# Patient Record
Sex: Male | Born: 1945
Health system: Southern US, Community
[De-identification: ages and names within clinical notes are randomized; demographics above are authoritative.]

## PROBLEM LIST (undated history)

## (undated) DIAGNOSIS — C4491 Basal cell carcinoma of skin, unspecified: Secondary | ICD-10-CM

## (undated) DIAGNOSIS — I5042 Chronic combined systolic (congestive) and diastolic (congestive) heart failure: Secondary | ICD-10-CM

## (undated) DIAGNOSIS — Q231 Congenital insufficiency of aortic valve: Secondary | ICD-10-CM

## (undated) DIAGNOSIS — E669 Obesity, unspecified: Secondary | ICD-10-CM

## (undated) DIAGNOSIS — Q2381 Bicuspid aortic valve: Secondary | ICD-10-CM

## (undated) DIAGNOSIS — I480 Paroxysmal atrial fibrillation: Secondary | ICD-10-CM

## (undated) DIAGNOSIS — G629 Polyneuropathy, unspecified: Secondary | ICD-10-CM

## (undated) DIAGNOSIS — M1990 Unspecified osteoarthritis, unspecified site: Secondary | ICD-10-CM

## (undated) HISTORY — DX: Chronic combined systolic (congestive) and diastolic (congestive) heart failure: I50.42

## (undated) HISTORY — PX: TONSILLECTOMY: SUR1361

## (undated) HISTORY — PX: KNEE SURGERY: SHX244

## (undated) HISTORY — DX: Bicuspid aortic valve: Q23.81

## (undated) HISTORY — DX: Congenital insufficiency of aortic valve: Q23.1

## (undated) HISTORY — DX: Polyneuropathy, unspecified: G62.9

## (undated) HISTORY — DX: Paroxysmal atrial fibrillation: I48.0

## (undated) HISTORY — PX: REPLACEMENT TOTAL KNEE: SUR1224

---

## 2008-09-27 ENCOUNTER — Ambulatory Visit: Payer: Self-pay | Admitting: Gastroenterology

## 2009-04-12 ENCOUNTER — Ambulatory Visit: Payer: Self-pay | Admitting: Unknown Physician Specialty

## 2009-04-24 ENCOUNTER — Ambulatory Visit: Payer: Self-pay | Admitting: Unknown Physician Specialty

## 2009-04-30 ENCOUNTER — Ambulatory Visit: Payer: Self-pay | Admitting: Unknown Physician Specialty

## 2011-05-26 ENCOUNTER — Ambulatory Visit: Payer: Self-pay

## 2011-06-14 ENCOUNTER — Ambulatory Visit: Payer: Self-pay | Admitting: Family Medicine

## 2012-10-20 ENCOUNTER — Ambulatory Visit: Payer: Self-pay | Admitting: Family Medicine

## 2015-05-22 ENCOUNTER — Ambulatory Visit (INDEPENDENT_AMBULATORY_CARE_PROVIDER_SITE_OTHER): Payer: Commercial Managed Care - HMO | Admitting: Family Medicine

## 2015-05-22 ENCOUNTER — Encounter: Payer: Self-pay | Admitting: Family Medicine

## 2015-05-22 VITALS — BP 102/65 | HR 65 | Temp 97.8°F | Ht 72.0 in | Wt 233.0 lb

## 2015-05-22 DIAGNOSIS — S86911A Strain of unspecified muscle(s) and tendon(s) at lower leg level, right leg, initial encounter: Secondary | ICD-10-CM

## 2015-05-22 DIAGNOSIS — S86919A Strain of unspecified muscle(s) and tendon(s) at lower leg level, unspecified leg, initial encounter: Secondary | ICD-10-CM | POA: Insufficient documentation

## 2015-05-22 DIAGNOSIS — S86811A Strain of other muscle(s) and tendon(s) at lower leg level, right leg, initial encounter: Secondary | ICD-10-CM | POA: Diagnosis not present

## 2015-05-22 MED ORDER — MELOXICAM 15 MG PO TABS: 15.0000 mg | ORAL_TABLET | Freq: Every day | ORAL | Status: DC

## 2015-05-22 NOTE — Assessment & Plan Note (Signed)
Discuss knee care use of meloxicam Using neoprene sleeve Knee exercises May use Tylenol also If problems persist may need further evaluation.

## 2015-05-22 NOTE — Progress Notes (Signed)
   BP 102/65 mmHg  Pulse 65  Temp(Src) 97.8 F (36.6 C)  Ht 6' (1.829 m)  Wt 233 lb (105.688 kg)  BMI 31.59 kg/m2  SpO2 96%   Subjective:    Patient ID: Christopher Vargas, male    DOB: [birth year], 69 y.o.   MRN: [id number]  HPI: TIMITHY Vargas is a 69 y.o. male  Chief Complaint  Patient presents with  . Knee Pain    interior right knee x 1 week   Patient with right medial knee pain over the last week. But is also had some intermittent chronic knee pain of arthritis off and on over the over the years. Both knees of been scoped for arthritis Patient a week ago just slightly twisted in his knee got hurt. Has been more than source since has taken some Aleve with modest help Has used Ace wrap and a neoprene sleeve. There's been no redness swelling joint locking clicking.   Relevant past medical, surgical, family and social history reviewed and updated as indicated. Interim medical history since our last visit reviewed. Allergies and medications reviewed and updated.  Review of Systems  Per HPI unless specifically indicated above     Objective:    BP 102/65 mmHg  Pulse 65  Temp(Src) 97.8 F (36.6 C)  Ht 6' (1.829 m)  Wt 233 lb (105.688 kg)  BMI 31.59 kg/m2  SpO2 96%  Wt Readings from Last 3 Encounters:  05/22/15 233 lb (105.688 kg)  12/31/14 239 lb (108.41 kg)  12/31/14 239 lb (108.41 kg)    Physical Exam  Musculoskeletal:  Knees with no evidence of redness or swelling Right knee some tenderness along the medial collateral ligament no clicking locking no joint laxity    No results found for this or any previous visit.    Assessment & Plan:   Problem List Items Addressed This Visit      Other   Strain of knee - Primary    Discuss knee care use of meloxicam Using neoprene sleeve Knee exercises May use Tylenol also If problems persist may need further evaluation.      Relevant Medications   meloxicam (MOBIC) 15 MG tablet       Follow up  plan: Return if symptoms worsen or fail to improve.

## 2015-07-22 ENCOUNTER — Telehealth: Payer: Self-pay | Admitting: Family Medicine

## 2015-07-22 DIAGNOSIS — M25561 Pain in right knee: Principal | ICD-10-CM

## 2015-07-22 DIAGNOSIS — G8929 Other chronic pain: Secondary | ICD-10-CM

## 2015-07-22 NOTE — Telephone Encounter (Signed)
Phone call Discussed with patient right knee just not getting better in fact getting worse We have tried care unsuccessfully here will refer to orthopedics patient prefers Dr. Jefm Bryant at [facility]

## 2015-07-22 NOTE — Telephone Encounter (Signed)
Pt called stated he is still having issues out of his knee, wants to know if he should come back in to see [facility] or request a referral for Ortho. Please call pt to discuss options. Thanks.

## 2015-09-05 ENCOUNTER — Other Ambulatory Visit: Payer: Self-pay | Admitting: Orthopedic Surgery

## 2015-09-05 DIAGNOSIS — M25561 Pain in right knee: Secondary | ICD-10-CM

## 2015-09-05 DIAGNOSIS — M1711 Unilateral primary osteoarthritis, right knee: Secondary | ICD-10-CM

## 2015-09-25 ENCOUNTER — Ambulatory Visit
Admission: RE | Admit: 2015-09-25 | Discharge: 2015-09-25 | Disposition: A | Discharge status: Home / Self Care | Payer: Commercial Managed Care - HMO | Source: Ambulatory Visit | Attending: Orthopedic Surgery | Admitting: Orthopedic Surgery

## 2015-09-25 DIAGNOSIS — M1711 Unilateral primary osteoarthritis, right knee: Secondary | ICD-10-CM | POA: Insufficient documentation

## 2015-09-25 DIAGNOSIS — M7121 Synovial cyst of popliteal space [Baker], right knee: Secondary | ICD-10-CM | POA: Insufficient documentation

## 2015-09-25 DIAGNOSIS — X501XXA Overexertion from prolonged static or awkward postures, initial encounter: Secondary | ICD-10-CM | POA: Insufficient documentation

## 2015-09-25 DIAGNOSIS — S83203A Other tear of unspecified meniscus, current injury, right knee, initial encounter: Secondary | ICD-10-CM | POA: Diagnosis not present

## 2015-09-25 DIAGNOSIS — M25561 Pain in right knee: Secondary | ICD-10-CM | POA: Insufficient documentation

## 2015-09-25 DIAGNOSIS — M25461 Effusion, right knee: Secondary | ICD-10-CM | POA: Insufficient documentation

## 2016-07-21 ENCOUNTER — Telehealth: Payer: Self-pay

## 2016-07-21 NOTE — Telephone Encounter (Signed)
Left message to call to find out more about his rash.

## 2016-07-21 NOTE — Telephone Encounter (Signed)
Christopher Vargas with [facility] left message that they are seeing patient for s/p knee replacement and that he has a rash on his back.

## 2016-07-21 NOTE — Telephone Encounter (Signed)
Larene Beach returned my call and stated that he just has a rash on his back. It is improving and healing, no open areas. They will monitor it for any changes.

## 2016-08-25 ENCOUNTER — Ambulatory Visit (INDEPENDENT_AMBULATORY_CARE_PROVIDER_SITE_OTHER): Payer: Commercial Managed Care - HMO | Admitting: Family Medicine

## 2016-08-25 ENCOUNTER — Encounter: Payer: Self-pay | Admitting: Family Medicine

## 2016-08-25 VITALS — BP 128/83 | HR 76 | Temp 97.6°F | Wt 242.0 lb

## 2016-08-25 DIAGNOSIS — J069 Acute upper respiratory infection, unspecified: Secondary | ICD-10-CM

## 2016-08-25 MED ORDER — AZITHROMYCIN 250 MG PO TABS: ORAL_TABLET | ORAL | 0 refills | Status: DC

## 2016-08-25 MED ORDER — HYDROCOD POLST-CPM POLST ER 10-8 MG/5ML PO SUER: 5.0000 mL | Freq: Two times a day (BID) | ORAL | 0 refills | Status: DC | PRN

## 2016-08-25 MED ORDER — BENZONATATE 100 MG PO CAPS: 200.0000 mg | ORAL_CAPSULE | Freq: Three times a day (TID) | ORAL | 0 refills | Status: DC | PRN

## 2016-08-25 NOTE — Progress Notes (Signed)
   BP 128/83   Pulse 76   Temp 97.6 F (36.4 C)   Wt 242 lb (109.8 kg)   SpO2 93%   BMI 32.82 kg/m    Subjective:    Patient ID: Christopher Vargas, male    DOB: 1946/08/07, 70 y.o.   MRN: [id number]  HPI: Christopher Vargas is a 70 y.o. male  Chief Complaint  Patient presents with  . URI    x 5 days, Head and chest congestion, sneezing, coughing.   Patient presents with almost a week of nasal congestion, sneezing, sore throat, and productive cough. Denies fever, chills, sweats, SOB, sinus pain. Taking sudafed and mucinex with minimal relief. Wife sick with same sxs.   Relevant past medical, surgical, family and social history reviewed and updated as indicated. Interim medical history since our last visit reviewed. Allergies and medications reviewed and updated.  Review of Systems  Constitutional: Negative.   HENT: Positive for congestion, rhinorrhea, sneezing and sore throat.   Eyes: Negative.   Respiratory: Positive for cough.   Cardiovascular: Negative.   Gastrointestinal: Negative.   Genitourinary: Negative.   Musculoskeletal: Negative.   Neurological: Negative.   Psychiatric/Behavioral: Negative.     Per HPI unless specifically indicated above     Objective:    BP 128/83   Pulse 76   Temp 97.6 F (36.4 C)   Wt 242 lb (109.8 kg)   SpO2 93%   BMI 32.82 kg/m   Wt Readings from Last 3 Encounters:  08/25/16 242 lb (109.8 kg)  05/22/15 233 lb (105.7 kg)  12/31/14 239 lb (108.4 kg)    Physical Exam  Constitutional: He is oriented to person, place, and time. He appears well-developed and well-nourished.  HENT:  Head: Atraumatic.  Right Ear: External ear normal.  Left Ear: External ear normal.  Nose: Nose normal.  Mouth/Throat: Oropharynx is clear and moist. No oropharyngeal exudate.  Eyes: Conjunctivae are normal. Pupils are equal, round, and reactive to light.  Neck: Normal range of motion. Neck supple.  Cardiovascular: Normal rate and normal heart  sounds.   Pulmonary/Chest: Effort normal and breath sounds normal. No respiratory distress.  Musculoskeletal: Normal range of motion.  Neurological: He is alert and oriented to person, place, and time.  Skin: Skin is warm and dry.  Psychiatric: He has a normal mood and affect. His behavior is normal.  Nursing note and vitals reviewed.   No results found for this or any previous visit.    Assessment & Plan:   Problem List Items Addressed This Visit    None    Visit Diagnoses    Upper respiratory tract infection, unspecified type    -  Primary   Will treat with z-pak, albuterol inhaler prn, tessalon perles and tussionex. Precautions given with tussionex. Cont OTC meds. Follow up if no improvement   Relevant Medications   azithromycin (ZITHROMAX) 250 MG tablet       Follow up plan: Return if symptoms worsen or fail to improve.

## 2016-08-25 NOTE — Patient Instructions (Signed)
Follow up as needed

## 2016-10-01 ENCOUNTER — Ambulatory Visit (INDEPENDENT_AMBULATORY_CARE_PROVIDER_SITE_OTHER): Payer: Medicare HMO

## 2016-10-01 DIAGNOSIS — Z23 Encounter for immunization: Secondary | ICD-10-CM | POA: Diagnosis not present

## 2017-05-24 DIAGNOSIS — M170 Bilateral primary osteoarthritis of knee: Secondary | ICD-10-CM | POA: Diagnosis not present

## 2017-05-24 DIAGNOSIS — Z791 Long term (current) use of non-steroidal anti-inflammatories (NSAID): Secondary | ICD-10-CM | POA: Diagnosis not present

## 2017-05-24 DIAGNOSIS — Z85828 Personal history of other malignant neoplasm of skin: Secondary | ICD-10-CM | POA: Diagnosis not present

## 2017-05-24 DIAGNOSIS — Z6829 Body mass index (BMI) 29.0-29.9, adult: Secondary | ICD-10-CM | POA: Diagnosis not present

## 2017-05-24 DIAGNOSIS — Z0000 Encounter for general adult medical examination without abnormal findings: Secondary | ICD-10-CM | POA: Diagnosis not present

## 2017-07-14 ENCOUNTER — Encounter: Payer: Self-pay | Admitting: Family Medicine

## 2017-07-14 DIAGNOSIS — R69 Illness, unspecified: Secondary | ICD-10-CM | POA: Diagnosis not present

## 2017-09-17 IMAGING — MR MR KNEE*R* W/O CM
4 of 5 series · 31 of 40 positions shown · non-contrast
Comparison: Report from 04/12/2009

CLINICAL DATA: Twisting injury of the right knee 3 months ago.
Prior knee surgery. Diffuse knee pain with cracking and popping
sensation.

EXAM:
MRI OF THE RIGHT KNEE WITHOUT CONTRAST
TECHNIQUE: Multiplanar, multisequence MR imaging of the knee was performed. No
intravenous contrast was administered.

[Series 3: PD fat-sat · axial · 3.0mm · 0.50mm/px · z∈[-80,+43]mm · 8 of 38 slices shown (1 of 3)]
[im 1/38]
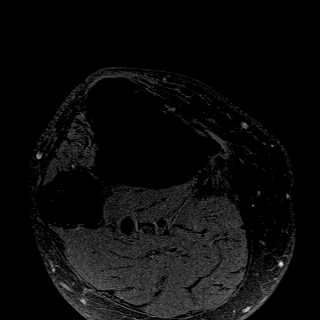
[im 6/38]
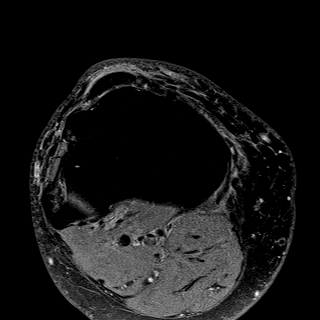
[im 11/38]
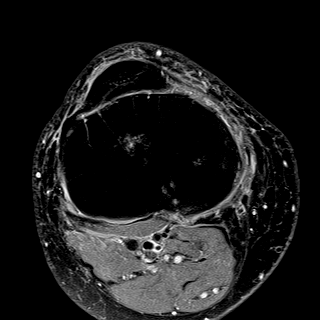
[im 16/38]
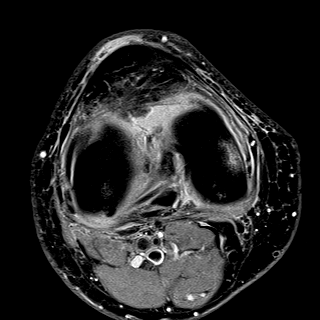
[im 22/38]
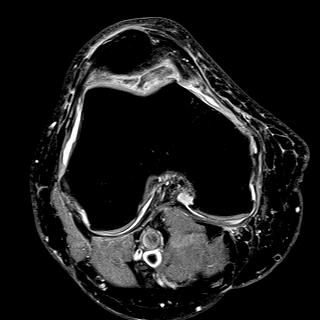
[im 27/38]
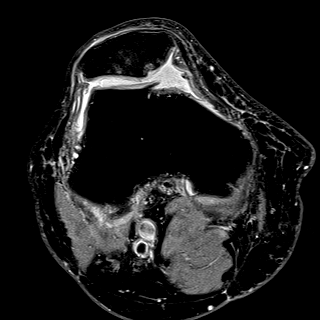
[im 32/38]
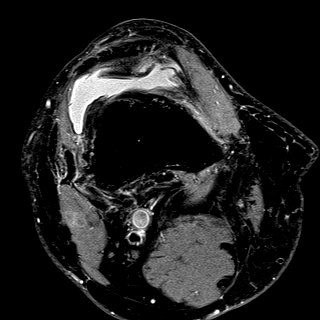
[im 38/38]
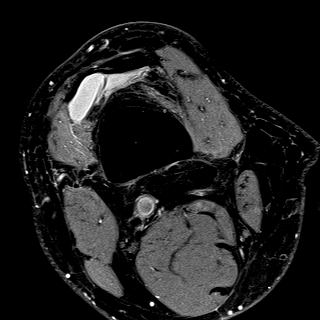

[Series 5: T2 fat-sat · coronal · 3.0mm · 0.31mm/px · 7 of 36 slices shown]
[im 1/36]
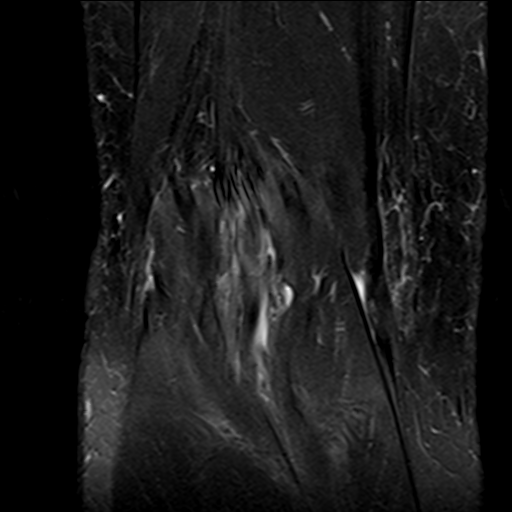
[im 6/36]
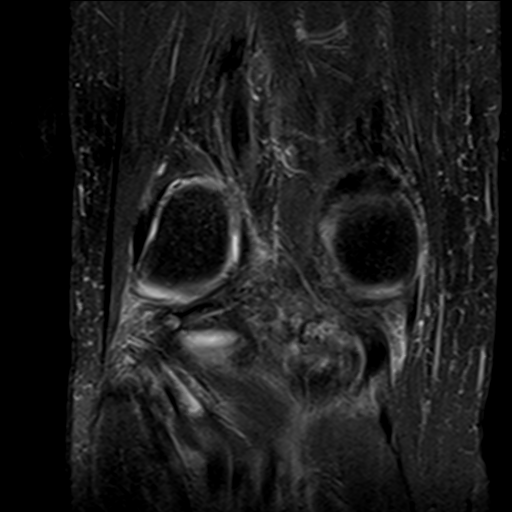
[im 11/36]
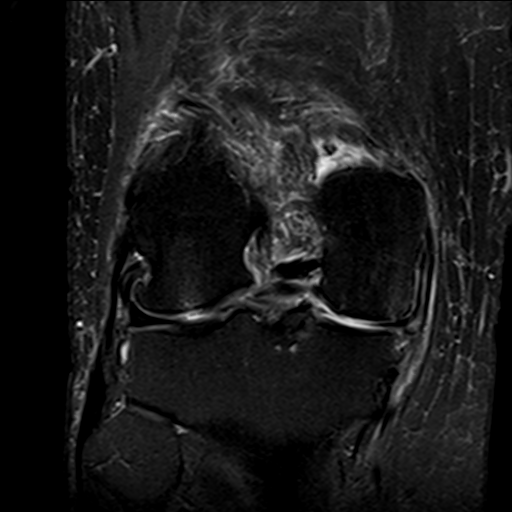
[im 16/36]
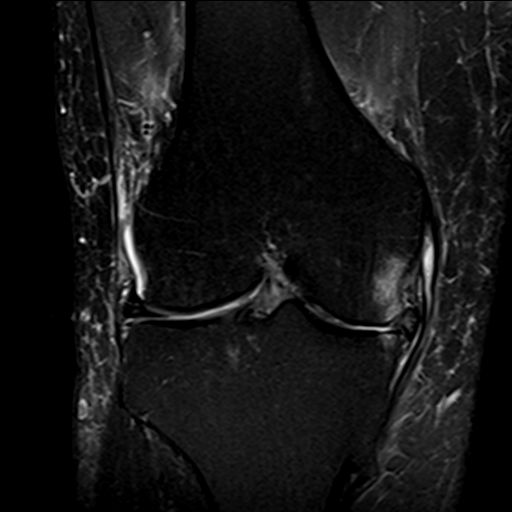
[im 21/36]
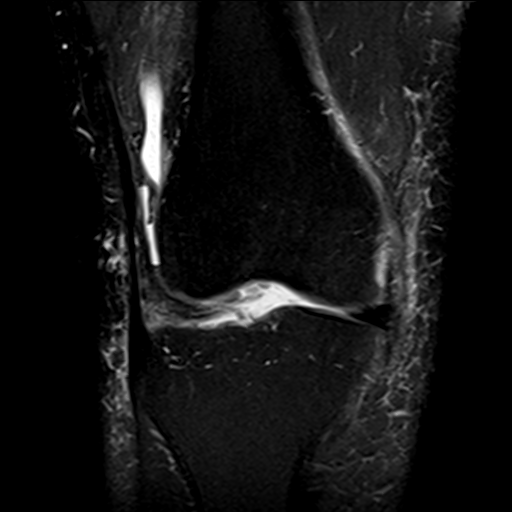
[im 26/36]
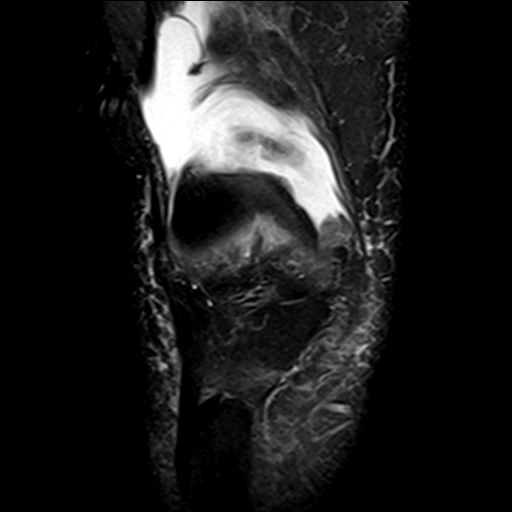
[im 31/36]
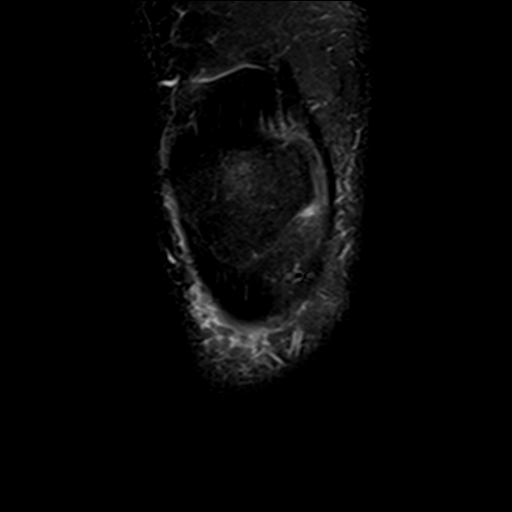

[Series 6: PD fat-sat · coronal · 3.0mm · 0.50mm/px · 8 of 36 slices shown (2 of 3)]
[im 1/36]
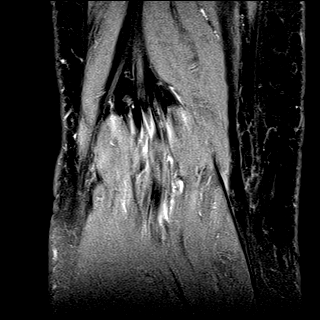
[im 6/36]
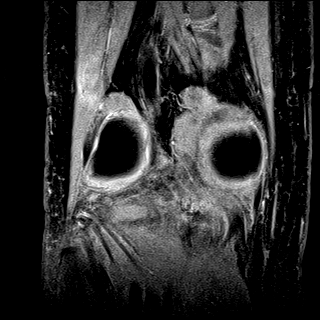
[im 11/36]
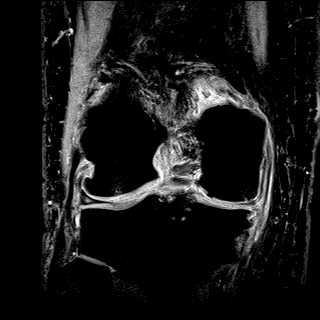
[im 16/36]
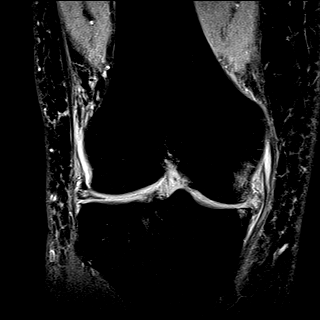
[im 21/36]
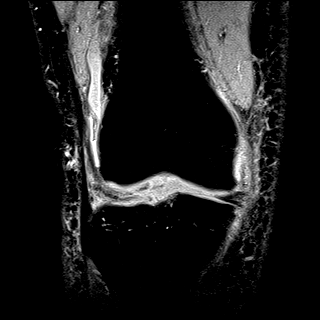
[im 26/36]
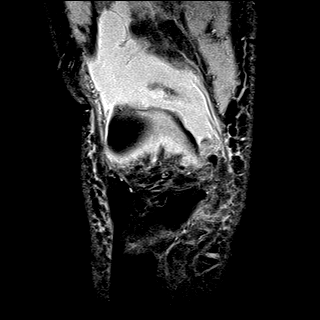
[im 31/36]
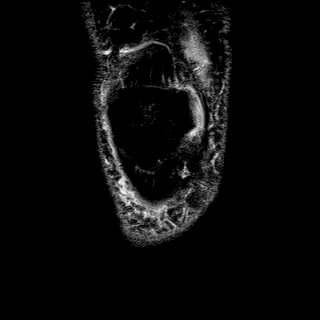
[im 36/36]
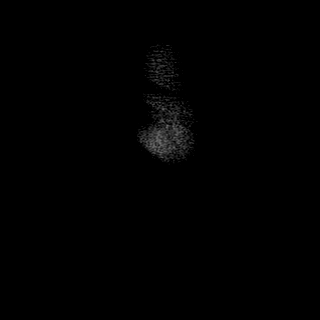

[Series 7: PD fat-sat · sagittal · 3.0mm · 0.50mm/px · 8 of 38 slices shown (3 of 3)]
[im 1/38]
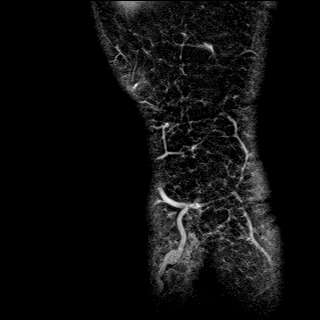
[im 6/38]
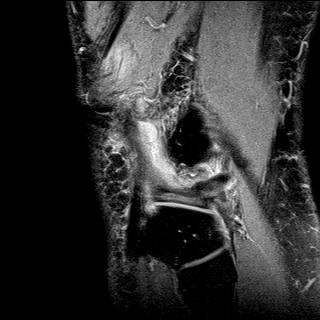
[im 11/38]
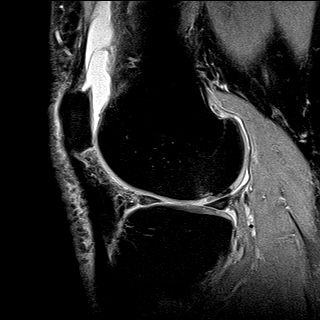
[im 16/38]
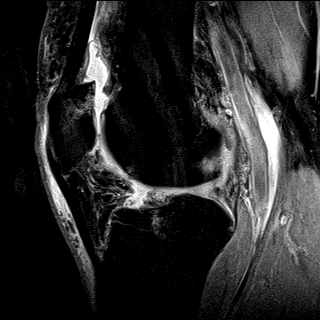
[im 22/38]
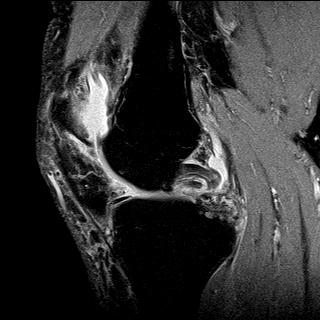
[im 27/38]
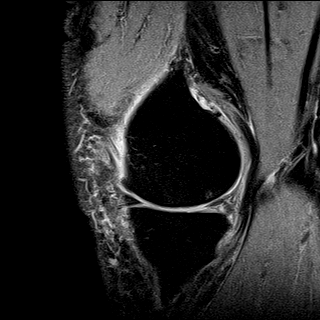
[im 32/38]
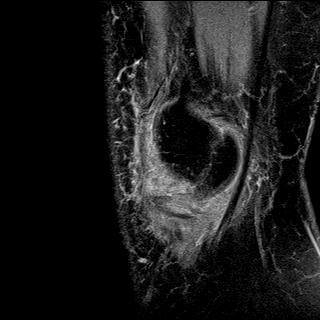
[im 38/38]
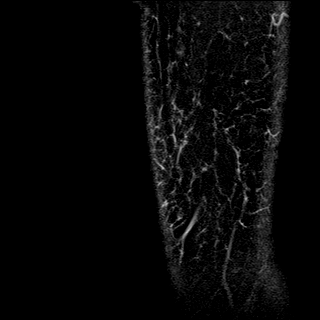

[31 of 40 positions shown; findings below may reference images not displayed]

FINDINGS: MENISCI

Medial meniscus: Vertical grade 3 signal in the posterior horn
medial meniscus extends longitudinally and there is also extensive
increased signal along the inferior surface extending longitudinally
in the posterior horn. In the midbody there is poor definition of
medial meniscal tissue due to severe degeneration, with abnormal
high signal along the entire superior and inferior surface. A grade
3 horizontal signal is present in the adjacent anterior horn medial
meniscus.

Lateral meniscus: Grade 1 signal in the posterior horn and anterior
horn of the lateral meniscus with equivocal horizontal extension to
the free edge in the midbody on images 67 series 7 and image 14
series 6.

LIGAMENTS

Cruciates: Slightly indistinct but otherwise intact ACL. PCL intact.

Collaterals: MCL bursitis superiorly, image 16 series 5. Proximal
popliteus tendinopathy potentially with partial intrasubstance
tearing in the origin of the popliteus tendon, image 12 series 5.

CARTILAGE

Patellofemoral: Severe and irregular chondral thinning,
full-thickness in a variety of locations, along the patellar facets
and posterior patellar ridge with scattered degenerative subcortical
foci of edema. Moderate to severe degenerative chondral thinning in
the femoral trochlear groove which appears somewhat shallow.
Marginal spurring.

Medial: Severe full-thickness loss of articular cartilage with
degenerative subcortical foci of edema most notably medially in the
medial femoral condyle. Marginal spurring.

Lateral: Generally moderate chondral thinning. Focal full-thickness
0.7 cm chondral defect centrally along the lateral femoral condyle,
image 10 series 5. Marginal spurring.

Joint: Moderate knee effusion. Thin medial plica. Low-level edema
infiltrating Hoffa's fat pad in a generalized fashion.

Popliteal Fossa:  Very small Baker's cyst.

Extensor Mechanism:  Unremarkable

Bones: No significant extra-articular osseous abnormalities
identified.
IMPRESSION: 1. Considerable complex signal in the medial meniscus suspicious for
degenerative read tearing.
2. Equivocal degenerative horizontal tearing in the midbody lateral
meniscus, extending to the free edge.
3. MCL bursitis.
4. Proximal popliteus tendinopathy potentially with some
intrasubstance partial tearing.
5. Severe osteoarthritis especially in the medial compartment and
patellofemoral joint. Large areas of full-thickness chondral loss in
the patellofemoral joint and medial compartment. Small focal
full-thickness chondral defect centrally along the lateral femoral
condyle.
6. Moderate knee effusion with very small Baker's cyst.

## 2017-10-27 DIAGNOSIS — R69 Illness, unspecified: Secondary | ICD-10-CM | POA: Diagnosis not present

## 2018-02-25 ENCOUNTER — Other Ambulatory Visit: Payer: Self-pay

## 2018-02-25 ENCOUNTER — Inpatient Hospital Stay
Admission: EM | Admit: 2018-02-25 | Discharge: 2018-03-01 | DRG: 308 | Disposition: A | Discharge status: Home / Self Care | Payer: Medicare HMO | Attending: Internal Medicine | Admitting: Internal Medicine

## 2018-02-25 ENCOUNTER — Inpatient Hospital Stay (HOSPITAL_COMMUNITY)
Admit: 2018-02-25 | Discharge: 2018-02-25 | Disposition: A | Discharge status: Home / Self Care | Payer: Medicare HMO | Attending: Internal Medicine | Admitting: Internal Medicine

## 2018-02-25 ENCOUNTER — Emergency Department: Payer: Medicare HMO

## 2018-02-25 DIAGNOSIS — I4891 Unspecified atrial fibrillation: Secondary | ICD-10-CM

## 2018-02-25 DIAGNOSIS — I509 Heart failure, unspecified: Secondary | ICD-10-CM | POA: Diagnosis not present

## 2018-02-25 DIAGNOSIS — R001 Bradycardia, unspecified: Secondary | ICD-10-CM | POA: Diagnosis not present

## 2018-02-25 DIAGNOSIS — I340 Nonrheumatic mitral (valve) insufficiency: Secondary | ICD-10-CM | POA: Diagnosis not present

## 2018-02-25 DIAGNOSIS — Z8249 Family history of ischemic heart disease and other diseases of the circulatory system: Secondary | ICD-10-CM

## 2018-02-25 DIAGNOSIS — R918 Other nonspecific abnormal finding of lung field: Secondary | ICD-10-CM

## 2018-02-25 DIAGNOSIS — Z96651 Presence of right artificial knee joint: Secondary | ICD-10-CM | POA: Diagnosis present

## 2018-02-25 DIAGNOSIS — I2510 Atherosclerotic heart disease of native coronary artery without angina pectoris: Secondary | ICD-10-CM | POA: Diagnosis not present

## 2018-02-25 DIAGNOSIS — Z791 Long term (current) use of non-steroidal anti-inflammatories (NSAID): Secondary | ICD-10-CM

## 2018-02-25 DIAGNOSIS — G629 Polyneuropathy, unspecified: Secondary | ICD-10-CM | POA: Diagnosis present

## 2018-02-25 DIAGNOSIS — I5031 Acute diastolic (congestive) heart failure: Secondary | ICD-10-CM | POA: Diagnosis not present

## 2018-02-25 DIAGNOSIS — I429 Cardiomyopathy, unspecified: Secondary | ICD-10-CM | POA: Diagnosis present

## 2018-02-25 DIAGNOSIS — I350 Nonrheumatic aortic (valve) stenosis: Secondary | ICD-10-CM

## 2018-02-25 DIAGNOSIS — E669 Obesity, unspecified: Secondary | ICD-10-CM | POA: Diagnosis not present

## 2018-02-25 DIAGNOSIS — Z882 Allergy status to sulfonamides status: Secondary | ICD-10-CM | POA: Diagnosis not present

## 2018-02-25 DIAGNOSIS — I481 Persistent atrial fibrillation: Secondary | ICD-10-CM | POA: Diagnosis not present

## 2018-02-25 DIAGNOSIS — Z6829 Body mass index (BMI) 29.0-29.9, adult: Secondary | ICD-10-CM | POA: Diagnosis not present

## 2018-02-25 DIAGNOSIS — I5021 Acute systolic (congestive) heart failure: Secondary | ICD-10-CM | POA: Diagnosis present

## 2018-02-25 DIAGNOSIS — J810 Acute pulmonary edema: Secondary | ICD-10-CM | POA: Diagnosis not present

## 2018-02-25 DIAGNOSIS — R0602 Shortness of breath: Secondary | ICD-10-CM | POA: Diagnosis not present

## 2018-02-25 DIAGNOSIS — R911 Solitary pulmonary nodule: Secondary | ICD-10-CM | POA: Diagnosis not present

## 2018-02-25 DIAGNOSIS — Z85828 Personal history of other malignant neoplasm of skin: Secondary | ICD-10-CM

## 2018-02-25 DIAGNOSIS — J90 Pleural effusion, not elsewhere classified: Secondary | ICD-10-CM | POA: Diagnosis not present

## 2018-02-25 DIAGNOSIS — Q231 Congenital insufficiency of aortic valve: Secondary | ICD-10-CM | POA: Diagnosis not present

## 2018-02-25 DIAGNOSIS — M1990 Unspecified osteoarthritis, unspecified site: Secondary | ICD-10-CM | POA: Diagnosis not present

## 2018-02-25 DIAGNOSIS — I480 Paroxysmal atrial fibrillation: Secondary | ICD-10-CM | POA: Diagnosis not present

## 2018-02-25 HISTORY — DX: Basal cell carcinoma of skin, unspecified: C44.91

## 2018-02-25 HISTORY — DX: Obesity, unspecified: E66.9

## 2018-02-25 HISTORY — DX: Unspecified osteoarthritis, unspecified site: M19.90

## 2018-02-25 LAB — CBC
HEMATOCRIT: 41.6 % (ref 40.0–52.0)
Hemoglobin: 13.9 g/dL (ref 13.0–18.0)
MCH: 30.2 pg (ref 26.0–34.0)
MCHC: 33.4 g/dL (ref 32.0–36.0)
MCV: 90.3 fL (ref 80.0–100.0)
PLATELETS: 265 10*3/uL (ref 150–440)
RBC: 4.61 MIL/uL (ref 4.40–5.90)
RDW: 14.9 % — ABNORMAL HIGH (ref 11.5–14.5)
WBC: 7.8 10*3/uL (ref 3.8–10.6)

## 2018-02-25 LAB — BASIC METABOLIC PANEL
ANION GAP: 7 (ref 5–15)
BUN: 22 mg/dL (ref 8–23)
CALCIUM: 8.4 mg/dL — AB (ref 8.9–10.3)
CO2: 24 mmol/L (ref 22–32)
Chloride: 106 mmol/L (ref 98–111)
Creatinine, Ser: 0.8 mg/dL (ref 0.61–1.24)
Glucose, Bld: 102 mg/dL — ABNORMAL HIGH (ref 70–99)
POTASSIUM: 4.3 mmol/L (ref 3.5–5.1)
SODIUM: 137 mmol/L (ref 135–145)

## 2018-02-25 LAB — HEPARIN LEVEL (UNFRACTIONATED): HEPARIN UNFRACTIONATED: 0.36 [IU]/mL (ref 0.30–0.70)

## 2018-02-25 LAB — TSH: TSH: 2.573 u[IU]/mL (ref 0.350–4.500)

## 2018-02-25 LAB — TROPONIN I

## 2018-02-25 LAB — APTT: APTT: 31 s (ref 24–36)

## 2018-02-25 LAB — ECHOCARDIOGRAM COMPLETE
Height: 73 in
Weight: 3680 oz

## 2018-02-25 LAB — PROTIME-INR
INR: 1.03
Prothrombin Time: 13.4 seconds (ref 11.4–15.2)

## 2018-02-25 LAB — MAGNESIUM: Magnesium: 2.1 mg/dL (ref 1.7–2.4)

## 2018-02-25 LAB — BRAIN NATRIURETIC PEPTIDE: B NATRIURETIC PEPTIDE 5: 229 pg/mL — AB (ref 0.0–100.0)

## 2018-02-25 MED ORDER — IOHEXOL 350 MG/ML SOLN
75.0000 mL | Freq: Once | INTRAVENOUS | Status: AC | PRN
  Administered 2018-02-25: 75 mL via INTRAVENOUS

## 2018-02-25 MED ORDER — POLYETHYLENE GLYCOL 3350 17 G PO PACK: 17.0000 g | PACK | Freq: Every day | ORAL | Status: DC | PRN

## 2018-02-25 MED ORDER — ALBUTEROL SULFATE (2.5 MG/3ML) 0.083% IN NEBU: 2.5000 mg | INHALATION_SOLUTION | RESPIRATORY_TRACT | Status: DC | PRN

## 2018-02-25 MED ORDER — METOPROLOL TARTRATE 5 MG/5ML IV SOLN
5.0000 mg | INTRAVENOUS | Status: DC | PRN
  Administered 2018-02-26: 5 mg via INTRAVENOUS
  Filled 2018-02-25: qty 5

## 2018-02-25 MED ORDER — DILTIAZEM HCL 25 MG/5ML IV SOLN
5.0000 mg | Freq: Once | INTRAVENOUS | Status: AC
  Administered 2018-02-25: 5 mg via INTRAVENOUS

## 2018-02-25 MED ORDER — FUROSEMIDE 10 MG/ML IJ SOLN
20.0000 mg | Freq: Once | INTRAMUSCULAR | Status: AC
  Administered 2018-02-25: 20 mg via INTRAVENOUS
  Filled 2018-02-25: qty 4

## 2018-02-25 MED ORDER — DILTIAZEM HCL 25 MG/5ML IV SOLN
INTRAVENOUS | Status: AC
  Administered 2018-02-25: 10 mg via INTRAVENOUS
  Filled 2018-02-25: qty 5

## 2018-02-25 MED ORDER — ACETAMINOPHEN 650 MG RE SUPP: 650.0000 mg | Freq: Four times a day (QID) | RECTAL | Status: DC | PRN

## 2018-02-25 MED ORDER — HEPARIN (PORCINE) IN NACL 100-0.45 UNIT/ML-% IJ SOLN
1400.0000 [IU]/h | INTRAMUSCULAR | Status: DC
  Administered 2018-02-25 – 2018-02-26 (×2): 1400 [IU]/h via INTRAVENOUS
  Filled 2018-02-25 (×2): qty 250

## 2018-02-25 MED ORDER — FUROSEMIDE 10 MG/ML IJ SOLN
40.0000 mg | Freq: Two times a day (BID) | INTRAMUSCULAR | Status: DC
  Administered 2018-02-25 – 2018-02-26 (×2): 40 mg via INTRAVENOUS
  Filled 2018-02-25 (×2): qty 4

## 2018-02-25 MED ORDER — METOPROLOL TARTRATE 25 MG PO TABS: 25.0000 mg | ORAL_TABLET | Freq: Two times a day (BID) | ORAL | Status: DC

## 2018-02-25 MED ORDER — METOPROLOL TARTRATE 25 MG PO TABS
25.0000 mg | ORAL_TABLET | Freq: Four times a day (QID) | ORAL | Status: DC
  Administered 2018-02-25 – 2018-02-26 (×3): 25 mg via ORAL
  Filled 2018-02-25 (×3): qty 1

## 2018-02-25 MED ORDER — ONDANSETRON HCL 4 MG PO TABS: 4.0000 mg | ORAL_TABLET | Freq: Four times a day (QID) | ORAL | Status: DC | PRN

## 2018-02-25 MED ORDER — DILTIAZEM HCL 25 MG/5ML IV SOLN
10.0000 mg | Freq: Once | INTRAVENOUS | Status: AC
  Administered 2018-02-25: 10 mg via INTRAVENOUS

## 2018-02-25 MED ORDER — SODIUM CHLORIDE 0.9% FLUSH
3.0000 mL | Freq: Two times a day (BID) | INTRAVENOUS | Status: DC
  Administered 2018-02-25 – 2018-02-28 (×6): 3 mL via INTRAVENOUS

## 2018-02-25 MED ORDER — ACETAMINOPHEN 325 MG PO TABS
650.0000 mg | ORAL_TABLET | Freq: Four times a day (QID) | ORAL | Status: DC | PRN
  Administered 2018-02-25: 650 mg via ORAL
  Filled 2018-02-25: qty 2

## 2018-02-25 MED ORDER — ENOXAPARIN SODIUM 40 MG/0.4ML ~~LOC~~ SOLN: 40.0000 mg | SUBCUTANEOUS | Status: DC

## 2018-02-25 MED ORDER — HEPARIN BOLUS VIA INFUSION
5000.0000 [IU] | Freq: Once | INTRAVENOUS | Status: AC
  Administered 2018-02-25: 5000 [IU] via INTRAVENOUS
  Filled 2018-02-25: qty 5000

## 2018-02-25 MED ORDER — ONDANSETRON HCL 4 MG/2ML IJ SOLN: 4.0000 mg | Freq: Four times a day (QID) | INTRAMUSCULAR | Status: DC | PRN

## 2018-02-25 MED ORDER — METOPROLOL TARTRATE 50 MG PO TABS
100.0000 mg | ORAL_TABLET | Freq: Once | ORAL | Status: AC
  Administered 2018-02-25: 100 mg via ORAL
  Filled 2018-02-25: qty 2

## 2018-02-25 MED ORDER — DILTIAZEM HCL 60 MG PO TABS
30.0000 mg | ORAL_TABLET | Freq: Once | ORAL | Status: DC
  Filled 2018-02-25: qty 1

## 2018-02-25 NOTE — Consult Note (Signed)
Cardiology Consultation:   Patient ID: Christopher Vargas; [id number]; 1945-10-21   Admit date: 02/25/2018 Date of Consult: 02/25/2018  Primary Care Provider: Guadalupe Maple, MD Primary Cardiologist: New to [facility] - consult by Fletcher Anon   Patient Profile:   Christopher Vargas is a 72 y.o. male with a hx of basal cell carcinoma of the right cheek, osteoarthritis of the right knee, and obesity who is being seen today for the evaluation of new onset A. fib with RVR and acute CHF at the request of Dr. Darvin Neighbours.  History of Present Illness:   Mr. Born has no previously known cardiac history.  Patient was recently on a cruise out of [place] to [place] just over 1 week ago.  While on this cruise, he developed symptoms consistent with a URI including nasal congestion, postnasal drip, sinus pressure, and a dry, nonproductive cough.  However, on the last day of the cruise the patient began to notice more shortness of breath with association of orthopnea.  He disembarked from the cruise ship and spent the next day in [place], [place] at [employer].  He continued to note symptoms consistent with a URI though was noticeably short of breath.  He made his way back to [place] and felt like his URI was improving however, on the evening of 6/27 he was significantly orthopneic requiring him to sleep sitting up when he was able to sleep.  No chest pain.  This was associated with tachypalpitations and lower extremity swelling.  Never had symptoms like this previously.  He reports 2 of his 3 sons have been diagnosed with A. fib and he has a brother and sister with A. fib as well.  Patient has no known history of hypertension, hyperlipidemia, or diabetes.  He was never a smoker, rarely drinks alcohol and denies illicit substances.  Because of his worsening shortness of breath with associated orthopnea and tachypalpitations he presented to [facility] where he was noted to be in new onset A. fib with RVR with ventricular  rates into the 150s bpm.  He was also noted to be volume overloaded.  He underwent CTA of the chest PE protocol given his new onset A. fib and recent travel history which was negative for pulmonary embolus.  CTA did show bilateral pleural effusions along with pulmonary edema.  There were also numerous pulmonary nodules throughout the lungs with recommendation to repeat imaging in 3 to 6 months.  He was noted to have coronary artery calcifications without cardiomegaly or pericardial effusion.  He was rate controlled with IV diltiazem and p.o. metoprolol with ventricular rates improving from the 150s to low 100s bpm.  Labs showed a BNP of 229, troponin less than 0.03, Na 137, potassium 4.3, glucose 102, BUN 22, serum creatinine 0.80, WBC 7.8, Hgb 13.9, PLT 265.  Upon admission, the patient was started on a heparin infusion.  He reports his tachypalpitations are improved though is still short of breath.  Never with chest pain.   Past Medical History:  Diagnosis Date  . Basal cell carcinoma   . Neuropathy   . Obesity   . Osteoarthritis     Past Surgical History:  Procedure Laterality Date  . KNEE SURGERY Bilateral   . REPLACEMENT TOTAL KNEE Right   . TONSILLECTOMY       Home Meds: Prior to Admission medications   Medication Sig Start Date End Date Taking? Authorizing Provider  azithromycin (ZITHROMAX) 250 MG tablet Take 2 tablets day one, then  1 tablet daily Patient not taking: Reported on 02/25/2018 08/25/16   Volney American, PA-C  benzonatate (TESSALON) 100 MG capsule Take 2 capsules (200 mg total) by mouth 3 (three) times daily as needed. Patient not taking: Reported on 02/25/2018 08/25/16   Volney American, PA-C  chlorpheniramine-HYDROcodone [facility]) 10-8 MG/5ML SUER Take 5 mLs by mouth every 12 (twelve) hours as needed for cough. Patient not taking: Reported on 02/25/2018 08/25/16   Volney American, PA-C  meloxicam (MOBIC) 15 MG tablet Take 1 tablet  (15 mg total) by mouth daily. Patient not taking: Reported on 02/25/2018 05/22/15   Guadalupe Maple, MD    Inpatient Medications: Scheduled Meds: . furosemide  40 mg Intravenous BID  . heparin  5,000 Units Intravenous Once  . sodium chloride flush  3 mL Intravenous Q12H   Continuous Infusions: . heparin     PRN Meds: acetaminophen **OR** acetaminophen, albuterol, metoprolol tartrate, ondansetron **OR** ondansetron (ZOFRAN) IV, polyethylene glycol  Allergies:   Allergies  Allergen Reactions  . Sulfa Antibiotics     Social History:   Social History   Socioeconomic History  . Marital status: Married    Spouse name: Not on file  . Number of children: Not on file  . Years of education: Not on file  . Highest education level: Not on file  Occupational History  . Not on file  Social Needs  . Financial resource strain: Not on file  . Food insecurity:    Worry: Not on file    Inability: Not on file  . Transportation needs:    Medical: Not on file    Non-medical: Not on file  Tobacco Use  . Smoking status: Never Smoker  . Smokeless tobacco: Never Used  Substance and Sexual Activity  . Alcohol use: Yes  . Drug use: No  . Sexual activity: Not on file  Lifestyle  . Physical activity:    Days per week: Not on file    Minutes per session: Not on file  . Stress: Not on file  Relationships  . Social connections:    Talks on phone: Not on file    Gets together: Not on file    Attends religious service: Not on file    Active member of club or organization: Not on file    Attends meetings of clubs or organizations: Not on file    Relationship status: Not on file  . Intimate partner violence:    Fear of current or ex partner: Not on file    Emotionally abused: Not on file    Physically abused: Not on file    Forced sexual activity: Not on file  Other Topics Concern  . Not on file  Social History Narrative  . Not on file     Family History:   Family History  Problem  Relation Age of Onset  . Heart disease Mother   . Hypertension Mother   . Hypertension Father   . Heart disease Father     ROS:  Review of Systems  Constitutional: Positive for malaise/fatigue. Negative for chills, diaphoresis, fever and weight loss.  HENT: Positive for congestion, sinus pain and sore throat.   Eyes: Negative for discharge and redness.  Respiratory: Positive for cough and shortness of breath. Negative for hemoptysis, sputum production and wheezing.   Cardiovascular: Positive for palpitations, orthopnea and leg swelling. Negative for chest pain, claudication and PND.  Gastrointestinal: Negative for abdominal pain, blood in stool, heartburn, melena,  nausea and vomiting.  Genitourinary: Negative for hematuria.  Musculoskeletal: Negative for falls and myalgias.  Skin: Negative for rash.  Neurological: Positive for weakness. Negative for dizziness, tingling, tremors, sensory change, speech change, focal weakness and loss of consciousness.  Endo/Heme/Allergies: Does not bruise/bleed easily.  Psychiatric/Behavioral: Negative for substance abuse. The patient is not nervous/anxious.   All other systems reviewed and are negative.     Physical Exam/Data:   Vitals:   02/25/18 1135 02/25/18 1202 02/25/18 1210 02/25/18 1253  BP: (!) 121/93 (!) 117/98 (!) 115/96 115/89  Pulse: (!) 112 (!) 135 61 64  Resp: 17 (!) 22 13 14   Temp:    98.6 F (37 C)  TempSrc:      SpO2: 98% 97% 99% 100%  Weight:      Height:        Intake/Output Summary (Last 24 hours) at 02/25/2018 1319 Last data filed at 02/25/2018 1230 Gross per 24 hour  Intake -  Output 1600 ml  Net -1600 ml   Filed Weights   02/25/18 0726  Weight: 230 lb (104.3 kg)   Body mass index is 30.34 kg/m.   Physical Exam: General: Well developed, well nourished, in no acute distress. Head: Normocephalic, atraumatic, sclera non-icteric, no xanthomas, nares without discharge.  Neck: Negative for carotid bruits. JVD  elevated to the angle of the mandible. Lungs: Diminished breath sounds bilaterally with crackles along the bilateral bases.  Breathing is unlabored. Heart: Tachycardic, irregularly irregular with S1 S2. No murmurs, rubs, or gallops appreciated. Abdomen: Soft, non-tender, mildly distended with normoactive bowel sounds. No hepatomegaly. No rebound/guarding. No obvious abdominal masses. Msk:  Strength and tone appear normal for age. Extremities: No clubbing or cyanosis.  1+ bilateral pitting edema to the mid shins. Distal pedal pulses are 2+ and equal bilaterally. Neuro: Alert and oriented X 3. No facial asymmetry. No focal deficit. Moves all extremities spontaneously. Psych:  Responds to questions appropriately with a normal affect.   EKG:  The EKG was personally reviewed and demonstrates: A. fib with RVR, 155 bpm, nonspecific ST-T changes Telemetry:  Telemetry was personally reviewed and demonstrates: A. fib with heart rates from the 90s to 120s bpm  Weights: Filed Weights   02/25/18 0726  Weight: 230 lb (104.3 kg)    Relevant CV Studies: None  Laboratory Data:  Chemistry Recent Labs  Lab 02/25/18 0857  NA 137  K 4.3  CL 106  CO2 24  GLUCOSE 102*  BUN 22  CREATININE 0.80  CALCIUM 8.4*  GFRNONAA >60  GFRAA >60  ANIONGAP 7    No results for input(s): PROT, ALBUMIN, AST, ALT, ALKPHOS, BILITOT in the last 168 hours. Hematology Recent Labs  Lab 02/25/18 0739  WBC 7.8  RBC 4.61  HGB 13.9  HCT 41.6  MCV 90.3  MCH 30.2  MCHC 33.4  RDW 14.9*  PLT 265   Cardiac Enzymes Recent Labs  Lab 02/25/18 0857  TROPONINI <0.03   No results for input(s): TROPIPOC in the last 168 hours.  BNP Recent Labs  Lab 02/25/18 0739  BNP 229.0*    DDimer No results for input(s): DDIMER in the last 168 hours.  Radiology/Studies:  Ct Angio Chest Pe W And/or Wo Contrast  Result Date: 02/25/2018 IMPRESSION: 1. Technically adequate exam showing no acute pulmonary embolus. 2. There are  bilateral pleural effusions and parenchymal changes consistent with pulmonary edema. 3. Numerous pulmonary nodules throughout the lungs. Although most of these are smooth and circumscribed, largest measuring 9 millimeters,  an irregular mass in the LEFT UPPER lobe is 2.2 x 1.3 centimeters. Considerations postinflammatory process and malignancy. Non-contrast chest CT at 3-6 months is recommended. If nodules persist, subsequent management will be based upon the most suspicious nodule(s). This recommendation follows the consensus statement: Guidelines for Management of Incidental Pulmonary Nodules Detected on CT Images: From the Fleischner Society 2017; Radiology 2017; 284:228-243. 4. Coronary artery calcifications. No cardiomegaly or pericardial effusion. Electronically Signed   By: Nolon Nations M.D.   On: 02/25/2018 10:41    Assessment and Plan:   1.  New onset A. fib with RVR: -Uncertain chronicity though patient indicates he did not feel tachypalpitations until the night prior however we are unable to fully determine if the patient developed A. fib while down in [place] or on his cruise.  Because of this, we will plan for rate control strategy at this time -Agree with heparin infusion until the patient has been ruled out and undergone echocardiogram that demonstrates preserved LV systolic function and normal wall motion -Given the patient's elevated CHADS2VASc of at least 2 (age, vascular disease) he will need long-term, full dose anticoagulation with a DOAC at discharge -Ventricular rates have improved to the low 100 bpm on metoprolol 100 mg -Continue p.o. metoprolol this afternoon, if needed could increase dosing frequency -TSH normal -Magnesium at goal -Potassium at goal -If the patient's ventricular rates remain well controlled at rest and with ambulation and if his shortness of breath returns back to baseline would plan for outpatient electrical cardioversion in approximately 3 to 4 weeks  after he has been adequately anticoagulated.  However, if the patient's ventricular rates remain difficult to control or if he remains symptomatic he would require TEE/DCCV prior to discharge  2.  Acute CHF: -Type unknown -Echocardiogram to evaluate LV systolic function, wall motion, valvular function, and right-sided pressure -Would plan to perform echocardiogram once ventricular rates are well controlled -Agree with IV diuresis with KCl repletion -If EF is found to be significantly reduced he may require inpatient ischemic evaluation especially given his coronary artery calcifications noted on CTA chest, however his cardiomyopathy could also be tachycardia mediated -For now, continue heparin drip as above until there is no further possible need of invasive intervention -CHF education -Daily weights, strict I's and O's  3.  Pulmonary nodule: -Follow-up as an outpatient   For questions or updates, please contact [facility] Please consult [email or web address] for contact info under Cardiology/STEMI.   Signed, Christell Faith, PA-C [place] Pager: [phone] 02/25/2018, 1:19 PM

## 2018-02-25 NOTE — Care Management (Signed)
Patient admitted with new onset atrial fib and heart failure.  Independent in all adls, no issues accessing medical care, obtaining medications or with transportation.  Current with  PCP.  Access to scales.  Not requiring supplemental 02.

## 2018-02-25 NOTE — ED Notes (Signed)
Floor unable to take report at this time.

## 2018-02-25 NOTE — H&P (Addendum)
[place] [facility] [place] NAME: Christopher Vargas    MR#:  [id number]  DATE OF BIRTH:  1946-07-06  DATE OF ADMISSION:  02/25/2018  PRIMARY CARE PHYSICIAN: Guadalupe Maple, MD   REQUESTING/REFERRING PHYSICIAN: Dr. Jacqualine Code  CHIEF COMPLAINT:   Chief Complaint  Patient presents with  . Chest Pain    HISTORY OF PRESENT ILLNESS:  Christopher Vargas  is a 72 y.o. male with a known history of remote back pain not on any home medications presents to the hospital complaining of a few days of chest congestion, shortness of breath and palpitations.  He also noticed mild lower committee swelling.  Here in the emergency room patient was found to have atrial fibrillation with tachycardia.  Was given Cardizem IV with some improvement.  But his heart rate has returned to the 140s at this time.  CT scan of the chest was done due to his recent road trip.  No pulmonary embolism has been found, pulmonary edema present.Has some pulmonary nodules  PAST MEDICAL HISTORY:   Past Medical History:  Diagnosis Date  . Neuropathy     PAST SURGICAL HISTORY:   Past Surgical History:  Procedure Laterality Date  . KNEE SURGERY Bilateral   . REPLACEMENT TOTAL KNEE Right   . TONSILLECTOMY      SOCIAL HISTORY:   Social History   Tobacco Use  . Smoking status: Never Smoker  . Smokeless tobacco: Never Used  Substance Use Topics  . Alcohol use: Yes    FAMILY HISTORY:   Family History  Problem Relation Age of Onset  . Heart disease Mother   . Hypertension Mother   . Hypertension Father   . Heart disease Father     DRUG ALLERGIES:   Allergies  Allergen Reactions  . Sulfa Antibiotics     REVIEW OF SYSTEMS:   Review of Systems  Constitutional: Positive for malaise/fatigue. Negative for chills and fever.  HENT: Negative for sore throat.   Eyes: Negative for blurred vision, double vision and pain.  Respiratory: Positive for cough and shortness of breath.  Negative for hemoptysis and wheezing.   Cardiovascular: Positive for chest pain, orthopnea and leg swelling. Negative for palpitations.  Gastrointestinal: Negative for abdominal pain, constipation, diarrhea, heartburn, nausea and vomiting.  Genitourinary: Negative for dysuria and hematuria.  Musculoskeletal: Negative for back pain and joint pain.  Skin: Negative for rash.  Neurological: Negative for sensory change, speech change, focal weakness and headaches.  Endo/Heme/Allergies: Does not bruise/bleed easily.  Psychiatric/Behavioral: Negative for depression. The patient is not nervous/anxious.     MEDICATIONS AT HOME:   Prior to Admission medications   Medication Sig Start Date End Date Taking? Authorizing Provider  azithromycin (ZITHROMAX) 250 MG tablet Take 2 tablets day one, then 1 tablet daily Patient not taking: Reported on 02/25/2018 08/25/16   Volney American, PA-C  benzonatate (TESSALON) 100 MG capsule Take 2 capsules (200 mg total) by mouth 3 (three) times daily as needed. Patient not taking: Reported on 02/25/2018 08/25/16   Volney American, PA-C  chlorpheniramine-HYDROcodone [facility]) 10-8 MG/5ML SUER Take 5 mLs by mouth every 12 (twelve) hours as needed for cough. Patient not taking: Reported on 02/25/2018 08/25/16   Volney American, PA-C  meloxicam (MOBIC) 15 MG tablet Take 1 tablet (15 mg total) by mouth daily. Patient not taking: Reported on 02/25/2018 05/22/15   Guadalupe Maple, MD     VITAL SIGNS:  Blood pressure (!) 127/98,  pulse (!) 125, temperature 98 F (36.7 C), temperature source Oral, resp. rate 13, height 6\' 1"  (1.854 m), weight 104.3 kg (230 lb), SpO2 98 %.  PHYSICAL EXAMINATION:  Physical Exam  GENERAL:  72 y.o.-year-old patient lying in the bed with no acute distress.  EYES: Pupils equal, round, reactive to light and accommodation. No scleral icterus. Extraocular muscles intact.  HEENT: Head atraumatic, normocephalic.  Oropharynx and nasopharynx clear. No oropharyngeal erythema, moist oral mucosa  NECK:  Supple, no jugular venous distention. No thyroid enlargement, no tenderness.  LUNGS: Bilateral crackles CARDIOVASCULAR: Irregularly irregular with tachycardia ABDOMEN: Soft, nontender, nondistended. Bowel sounds present. No organomegaly or mass.  EXTREMITIES: No pedal edema, cyanosis, or clubbing. + 2 pedal & radial pulses b/l.   NEUROLOGIC: Cranial nerves II through XII are intact. No focal Motor or sensory deficits appreciated b/l PSYCHIATRIC: The patient is alert and oriented x 3. Good affect.  SKIN: No obvious rash, lesion, or ulcer.   LABORATORY PANEL:   CBC Recent Labs  Lab 02/25/18 0739  WBC 7.8  HGB 13.9  HCT 41.6  PLT 265   ------------------------------------------------------------------------------------------------------------------  Chemistries  Recent Labs  Lab 02/25/18 0857  NA 137  K 4.3  CL 106  CO2 24  GLUCOSE 102*  BUN 22  CREATININE 0.80  CALCIUM 8.4*   ------------------------------------------------------------------------------------------------------------------  Cardiac Enzymes Recent Labs  Lab 02/25/18 0857  TROPONINI <0.03   ------------------------------------------------------------------------------------------------------------------  RADIOLOGY:  Ct Angio Chest Pe W And/or Wo Contrast  Result Date: 02/25/2018 CLINICAL DATA:  Recent long travel. New onset atrial fibrillation. Prior knee replacement. Rule out PE. EXAM: CT ANGIOGRAPHY CHEST WITH CONTRAST TECHNIQUE: Multidetector CT imaging of the chest was performed using the standard protocol during bolus administration of intravenous contrast. Multiplanar CT image reconstructions and MIPs were obtained to evaluate the vascular anatomy. CONTRAST:  40mL OMNIPAQUE IOHEXOL 350 MG/ML SOLN, 68mL OMNIPAQUE IOHEXOL 350 MG/ML SOLN COMPARISON:  None. FINDINGS: Cardiovascular: Heart size is normal. No pericardial  effusion. There are focal coronary artery calcifications. The pulmonary arteries are well opacified and there is no acute pulmonary embolus. The thoracic aorta is tortuous but otherwise normal in appearance. Mediastinum/Nodes: The visualized portion of the thyroid gland has a normal appearance. Esophagus is normal in appearance. No mediastinal, hilar, or axillary adenopathy. Lungs/Pleura: There are moderate bilateral pleural effusions. Within the LEFT UPPER lobe there is a 2.2 x 1.3 centimeter spiculated density. Multiple circumscribed pulmonary nodules are identified throughout the lungs, largest identified within the LEFT LOWER lobe, measuring 9 millimeters in diameter. There is diffuse smooth septal wall thickening and patchy areas of airspace filling, consistent with pulmonary edema. Upper Abdomen: There is probable contrast within the collecting system of the LEFT kidney. The gallbladder is present. Musculoskeletal: Significant degenerative changes in the midthoracic spine. Review of the MIP images confirms the above findings. IMPRESSION: 1. Technically adequate exam showing no acute pulmonary embolus. 2. There are bilateral pleural effusions and parenchymal changes consistent with pulmonary edema. 3. Numerous pulmonary nodules throughout the lungs. Although most of these are smooth and circumscribed, largest measuring 9 millimeters, an irregular mass in the LEFT UPPER lobe is 2.2 x 1.3 centimeters. Considerations postinflammatory process and malignancy. Non-contrast chest CT at 3-6 months is recommended. If nodules persist, subsequent management will be based upon the most suspicious nodule(s). This recommendation follows the consensus statement: Guidelines for Management of Incidental Pulmonary Nodules Detected on CT Images: From the Fleischner Society 2017; Radiology 2017; 284:228-243. 4. Coronary artery calcifications. No cardiomegaly or pericardial effusion.  Electronically Signed   By: Nolon Nations  M.D.   On: 02/25/2018 10:41     IMPRESSION AND PLAN:   *New onset atrial fibrillation with rapid ventricular rate Patient will be admitted to telemetry floor.  Will give stat dose of Lopressor due to heart rate being in the 140s.  If no improvement will need to start him on IV drip. Check TSH, echocardiogram Start heparin drip. Discussed with Dr. Fletcher Anon of cardiology.  *Congestive heart failure.  New.  Check echocardiogram to classify systolic versus diastolic - IV Lasix, Beta blockers - Input and Output - Monitor Bun/Cr and Potassium - Echo -Likely due to atrial fibrillation  *Pulmonary nodules.  Will need follow-up CT chest in 3 to 6 months with primary care physician.  All the records are reviewed and case discussed with ED provider. Management plans discussed with the patient, family and they are in agreement.  CODE STATUS: Full code  TOTAL CC TIME TAKING CARE OF THIS PATIENT: 40 minutes.   Leia Alf Xee Hollman M.D on 02/25/2018 at 11:36 AM  Between 7am to 6pm - Pager - [phone]  After 6pm go to [email or web address] - password [email or web address] [facility]  Office  [phone]  CC: Primary care physician; Guadalupe Maple, MD  Note: This dictation was prepared with Dragon dictation along with smaller phrase technology. Any transcriptional errors that result from this process are unintentional.

## 2018-02-25 NOTE — Discharge Instructions (Addendum)
Heart healthy diet. °Activity as tolerated. °

## 2018-02-25 NOTE — ED Notes (Signed)
Consulting provider at bedside

## 2018-02-25 NOTE — Progress Notes (Signed)
[facility] for heparin drip management   Indication: atrial fibrillation  Allergies  Allergen Reactions  . Sulfa Antibiotics     Patient Measurements: Height: 6\' 1"  (185.4 cm) Weight: 235 lb 4.8 oz (106.7 kg) IBW/kg (Calculated) : 79.9 Heparin Dosing Weight: 101  Vital Signs: Temp: 98.4 F (36.9 C) (06/28 1956) Temp Source: Oral (06/28 1956) BP: 111/95 (06/28 1956) Pulse Rate: 61 (06/28 1956)  Labs: Recent Labs    02/25/18 0739 02/25/18 0857 02/25/18 1339 02/25/18 1934  HGB 13.9  --   --   --   HCT 41.6  --   --   --   PLT 265  --   --   --   APTT 31  --   --   --   LABPROT 13.4  --   --   --   INR 1.03  --   --   --   HEPARINUNFRC  --   --   --  0.36  CREATININE  --  0.80  --   --   TROPONINI  --  <0.03 <0.03 <0.03    Estimated Creatinine Clearance: 110.1 mL/min (by C-G formula based on SCr of 0.8 mg/dL).   Medical History: Past Medical History:  Diagnosis Date  . Basal cell carcinoma   . Neuropathy   . Obesity   . Osteoarthritis     Medications:  Scheduled:  . furosemide  40 mg Intravenous BID  . metoprolol tartrate  25 mg Oral Q6H  . sodium chloride flush  3 mL Intravenous Q12H   Infusions:  . heparin 1,400 Units/hr (02/25/18 1320)    Assessment: Pharmacy consulted for heparin drip management for 72 yo male admitted with atrial fibrillation. Patient dose not take oral anticoagulation as an outpatient.       Goal of Therapy:  Heparin level 0.3-0.7 units/ml Monitor platelets by anticoagulation protocol: Yes   Plan:  Will bolus heparin 5000 units x 1 and start heparin infusion at 1450 units/hr.   Pharmacy will continue to monitor and adjust per consult.   6/28:  HL @ 19:30 = 0.38 Will continue this pt on current rate and recheck HL in 6 hrs on 6/29 @ 0130.   Cary Lothrop D 02/25/2018,9:44 PM

## 2018-02-25 NOTE — Progress Notes (Signed)
*  PRELIMINARY RESULTS* Echocardiogram 2D Echocardiogram has been performed.  Sherrie Sport 02/25/2018, 3:17 PM

## 2018-02-25 NOTE — ED Provider Notes (Signed)
[facility] Emergency Department Provider Note   ____________________________________________   First MD Initiated Contact with Patient 02/25/18 [phone]     (approximate)  I have reviewed the triage vital signs and the nursing notes.   HISTORY  Chief Complaint Chest Pain    HPI Christopher Vargas is a 72 y.o. male no major medical history  Patient reports he went on a cruise last week of the [place] down to [place].  After getting off the cruise in [place] he began to develop a cough, nasal congestion, runny nose, and fatigue.  He stayed back a day from [place], but reports he started this get a little bit better.  He has not been feeling perfect with a slight lingering cough, was taking some over-the-counter cough medications but he stopped those 2 days ago.  This morning when he woke up he noticed that he felt fatigued, short of breath, very short of breath with exertion, and feels at times that his heart feels like it is beating erratically  He reports he has a family history including 2 sons that have A. fib.  He has not had any fevers.  No nausea vomiting.  He is noticed both of his lower legs seem to slightly swollen the last day or 2.  He has had a remote right knee replacement.  No history of blood clots.  No fevers or chills.  No vomiting or any hemoptysis.  He does report some slight lower extremity swelling around both ankles.  He does have a travel history having driven about 12 hours to get home from [place]   Past Medical History:  Diagnosis Date  . Neuropathy     Patient Active Problem List   Diagnosis Date Noted  . Strain of knee 05/22/2015    Past Surgical History:  Procedure Laterality Date  . KNEE SURGERY Bilateral   . REPLACEMENT TOTAL KNEE Right   . TONSILLECTOMY      Prior to Admission medications   Medication Sig Start Date End Date Taking? Authorizing Provider  azithromycin (ZITHROMAX) 250 MG tablet Take 2 tablets day  one, then 1 tablet daily 08/25/16   Volney American, PA-C  benzonatate (TESSALON) 100 MG capsule Take 2 capsules (200 mg total) by mouth 3 (three) times daily as needed. 08/25/16   Volney American, PA-C  chlorpheniramine-HYDROcodone [facility]) 10-8 MG/5ML SUER Take 5 mLs by mouth every 12 (twelve) hours as needed for cough. 08/25/16   Volney American, PA-C  meloxicam (MOBIC) 15 MG tablet Take 1 tablet (15 mg total) by mouth daily. 05/22/15   Guadalupe Maple, MD    Allergies Sulfa antibiotics  Family History  Problem Relation Age of Onset  . Heart disease Mother   . Hypertension Mother   . Hypertension Father   . Heart disease Father     Social History Social History   Tobacco Use  . Smoking status: Never Smoker  . Smokeless tobacco: Never Used  Substance Use Topics  . Alcohol use: Yes  . Drug use: No    Review of Systems Constitutional: No fever/chills Eyes: No visual changes. ENT: No sore throat. Cardiovascular: Denies chest pain.  Some palpitations. Respiratory: Slight feeling of shortness of breath, much worse with gastrointestinal: No abdominal pain.  No nausea, no vomiting.  No diarrhea.  No constipation. Genitourinary: Negative for dysuria. Musculoskeletal: Negative for back pain. Skin: Negative for rash. Neurological: Negative for headaches, focal weakness or numbness.    ____________________________________________  PHYSICAL EXAM:  VITAL SIGNS: ED Triage Vitals  Enc Vitals Group     BP 02/25/18 0727 (!) 128/107     Pulse Rate 02/25/18 0727 (!) 156     Resp 02/25/18 0727 (!) 22     Temp 02/25/18 0727 98 F (36.7 C)     Temp Source 02/25/18 0727 Oral     SpO2 02/25/18 0727 95 %     Weight 02/25/18 0726 230 lb (104.3 kg)     Height 02/25/18 0726 6\' 1"  (1.854 m)     Head Circumference --      Peak Flow --      Pain Score 02/25/18 0726 6     Pain Loc --      Pain Edu? --      Excl. in [place]? --     Constitutional:  Alert and oriented. Well appearing and in no acute distress. Eyes: Conjunctivae are normal. Head: Atraumatic. Nose: No congestion/rhinnorhea. Mouth/Throat: Mucous membranes are moist. Neck: No stridor.   Cardiovascular: Tachycardic, rate about 150, irregular.  Grossly normal heart sounds.  Good peripheral circulation.  No JVD. Respiratory: Normal respiratory effort.  No retractions. Lungs CTAB.  No cough at present. Gastrointestinal: Soft and nontender. No distention. Musculoskeletal: No lower extremity tenderness has trace bilateral lower extremity edema about the ankles. Neurologic:  Normal speech and language. No gross focal neurologic deficits are appreciated.  Skin:  Skin is warm, dry and intact. No rash noted. Psychiatric: Mood and affect are normal. Speech and behavior are normal.  ____________________________________________   LABS (all labs ordered are listed, but only abnormal results are displayed)  Labs Reviewed  CBC - Abnormal; Notable for the following components:      Result Value   RDW 14.9 (*)    All other components within normal limits  BASIC METABOLIC PANEL - Abnormal; Notable for the following components:   Glucose, Bld 102 (*)    Calcium 8.4 (*)    All other components within normal limits  PROTIME-INR  APTT  TROPONIN I  BRAIN NATRIURETIC PEPTIDE   ____________________________________________  EKG  Reviewed and entered by me at 7:30 AM Heart rate 155 QRS 100 QTc 470 Atrial fibrillation, rapid ventricular response. ____________________________________________  RADIOLOGY  Ct Angio Chest Pe W And/or Wo Contrast  Result Date: 02/25/2018 CLINICAL DATA:  Recent long travel. New onset atrial fibrillation. Prior knee replacement. Rule out PE. EXAM: CT ANGIOGRAPHY CHEST WITH CONTRAST TECHNIQUE: Multidetector CT imaging of the chest was performed using the standard protocol during bolus administration of intravenous contrast. Multiplanar CT image  reconstructions and MIPs were obtained to evaluate the vascular anatomy. CONTRAST:  19mL OMNIPAQUE IOHEXOL 350 MG/ML SOLN, 27mL OMNIPAQUE IOHEXOL 350 MG/ML SOLN COMPARISON:  None. FINDINGS: Cardiovascular: Heart size is normal. No pericardial effusion. There are focal coronary artery calcifications. The pulmonary arteries are well opacified and there is no acute pulmonary embolus. The thoracic aorta is tortuous but otherwise normal in appearance. Mediastinum/Nodes: The visualized portion of the thyroid gland has a normal appearance. Esophagus is normal in appearance. No mediastinal, hilar, or axillary adenopathy. Lungs/Pleura: There are moderate bilateral pleural effusions. Within the LEFT UPPER lobe there is a 2.2 x 1.3 centimeter spiculated density. Multiple circumscribed pulmonary nodules are identified throughout the lungs, largest identified within the LEFT LOWER lobe, measuring 9 millimeters in diameter. There is diffuse smooth septal wall thickening and patchy areas of airspace filling, consistent with pulmonary edema. Upper Abdomen: There is probable contrast within the collecting system of  the LEFT kidney. The gallbladder is present. Musculoskeletal: Significant degenerative changes in the midthoracic spine. Review of the MIP images confirms the above findings. IMPRESSION: 1. Technically adequate exam showing no acute pulmonary embolus. 2. There are bilateral pleural effusions and parenchymal changes consistent with pulmonary edema. 3. Numerous pulmonary nodules throughout the lungs. Although most of these are smooth and circumscribed, largest measuring 9 millimeters, an irregular mass in the LEFT UPPER lobe is 2.2 x 1.3 centimeters. Considerations postinflammatory process and malignancy. Non-contrast chest CT at 3-6 months is recommended. If nodules persist, subsequent management will be based upon the most suspicious nodule(s). This recommendation follows the consensus statement: Guidelines for Management  of Incidental Pulmonary Nodules Detected on CT Images: From the Fleischner Society 2017; Radiology 2017; 284:228-243. 4. Coronary artery calcifications. No cardiomegaly or pericardial effusion. Electronically Signed   By: Nolon Nations M.D.   On: 02/25/2018 10:41    CT scan result reviewed, no pulmonary embolism.  Bilateral pleural effusions.  Numerous nodules.  Viewed by me ____________________________________________   PROCEDURES  Procedure(s) performed: None  Procedures  Critical Care performed: Yes, see critical care note(s)  CRITICAL CARE Performed by: Delman Kitten   Total critical care time: 35 minutes  Critical care time was exclusive of separately billable procedures and treating other patients.  Critical care was necessary to treat or prevent imminent or life-threatening deterioration.  Critical care was time spent personally by me on the following activities: development of treatment plan with patient and/or surrogate as well as nursing, discussions with consultants, evaluation of patient's response to treatment, examination of patient, obtaining history from patient or surrogate, ordering and performing treatments and interventions, ordering and review of laboratory studies, ordering and review of radiographic studies, pulse oximetry and re-evaluation of patient's condition.  Patient was symptomatic A. fib with RVR, heart rate greater than 150.  Given multiple doses of IV diltiazem for rate control given his associated symptomatology and elevated rate.  Need for rate control. ____________________________________________   INITIAL IMPRESSION / ASSESSMENT AND PLAN / ED COURSE  Pertinent labs & imaging results that were available during my care of the patient were reviewed by me and considered in my medical decision making (see chart for details).  Patient returns for evaluation of dyspnea, palpitations.  Recent cruise, travel history, some reported ankle swelling,  previous right knee surgery seem to elevate his risk for thromboembolic disease somewhat, though also given his symptoms it sounds somewhat like he could have upper respiratory infection likely a viral and felt less likely bacterial etiology, followed by what seems to indicate atrial fibrillation with RVR that may have started sometime during the night oral somewhat unclearly but possibly a few days ago.  He is hemodynamically stable except for his tachycardia, will give a dose of IV diltiazem, and given his elevated risk and elevated pretest probability for pulmonary embolism given this presentation and new onset A. fib we will proceed with CT angiography to evaluate further for pulmonary embolism, pneumonia, or other inciting etiology.  Denies ischemic symptoms such as chest pain, will send troponin.  ----------------------------------------- 8:16 AM on 02/25/2018 -----------------------------------------  Heart rate now ranging between 100-1 20, improved after diltiazem and normotensive at this time.  We will give a small additional dose of IV diltiazem, awaiting CT and additional lab studies.  Patient appears improved.    ----------------------------------------- 11:00 AM on 02/25/2018 -----------------------------------------  Patient resting comfortably.  Did have contrast extravasation into the left upper arm but he denies it is causing  pain or symptoms.  No numbness or weakness.  Strong radial pulses.  Skin is not tense, no overlying skin changes.  Continue to ice and elevate off and on.  Discussed with patient, reviewed results, plan to start on a small dose of IV Lasix, admit for further evaluation.  Have placed consult request with Dr. Fletcher Anon.  Discussed in place consult with Dr. Mariea Clonts who will see in consult today.  Agrees with current plan recommends IV diuresis.  ____________________________________________   FINAL CLINICAL IMPRESSION(S) / ED DIAGNOSES  Final diagnoses:  Acute  pulmonary edema ([place])  Pulmonary nodules  Atrial fibrillation with rapid ventricular response ([facility])      NEW MEDICATIONS STARTED DURING THIS VISIT:  New Prescriptions   No medications on file     Note:  This document was prepared using Dragon voice recognition software and may include unintentional dictation errors.     Delman Kitten, MD 02/25/18 1104

## 2018-02-25 NOTE — ED Notes (Signed)
Cardiologist speaking with patient at bedside.

## 2018-02-25 NOTE — Progress Notes (Signed)
[place] for heparin drip management   Indication: atrial fibrillation  Allergies  Allergen Reactions  . Sulfa Antibiotics     Patient Measurements: Height: 6\' 1"  (185.4 cm) Weight: 230 lb (104.3 kg) IBW/kg (Calculated) : 79.9 Heparin Dosing Weight: 101  Vital Signs: Temp: 98 F (36.7 C) (06/28 0727) Temp Source: Oral (06/28 0727) BP: 127/98 (06/28 1100) Pulse Rate: 125 (06/28 1127)  Labs: Recent Labs    02/25/18 0739 02/25/18 0857  HGB 13.9  --   HCT 41.6  --   PLT 265  --   APTT 31  --   LABPROT 13.4  --   INR 1.03  --   CREATININE  --  0.80  TROPONINI  --  <0.03    Estimated Creatinine Clearance: 109 mL/min (by C-G formula based on SCr of 0.8 mg/dL).   Medical History: Past Medical History:  Diagnosis Date  . Neuropathy     Medications:  Scheduled:  . furosemide  40 mg Intravenous BID  . metoprolol tartrate  25 mg Oral Q6H  . sodium chloride flush  3 mL Intravenous Q12H   Infusions:  . heparin 1,400 Units/hr (02/25/18 1320)    Assessment: Pharmacy consulted for heparin drip management for 72 yo male admitted with atrial fibrillation. Patient dose not take oral anticoagulation as an outpatient.       Goal of Therapy:  Heparin level 0.3-0.7 units/ml Monitor platelets by anticoagulation protocol: Yes   Plan:  Will bolus heparin 5000 units x 1 and start heparin infusion at 1450 units/hr.   Pharmacy will continue to monitor and adjust per consult.   Simpson,Michael L 02/25/2018,11:42 AM

## 2018-02-25 NOTE — Progress Notes (Signed)
72 yo wm admitted to room 238 from ED with new onset Afib/CHF.  A&O x 3, gait steady.  No distress on ra.  Cardiac monitor placed on pt and verified with Roselyn Reef, CNA. Denies chest pain.  Lungs diminished with crackles noted.  Non pitting edema lower legs bil.  Oriented to room and surroundings, POC reviewed with pt.  CB in reach, SR up x2.

## 2018-02-25 NOTE — ED Triage Notes (Signed)
Pt c/o substernal chest pain with SOB since 230am. Pt denies any cardiac hx. Pt is tachypneic on arrival

## 2018-02-26 ENCOUNTER — Encounter: Payer: Self-pay | Admitting: *Deleted

## 2018-02-26 DIAGNOSIS — I481 Persistent atrial fibrillation: Principal | ICD-10-CM

## 2018-02-26 LAB — BASIC METABOLIC PANEL
Anion gap: 8 (ref 5–15)
BUN: 21 mg/dL (ref 8–23)
CO2: 26 mmol/L (ref 22–32)
Calcium: 8.5 mg/dL — ABNORMAL LOW (ref 8.9–10.3)
Chloride: 105 mmol/L (ref 98–111)
Creatinine, Ser: 0.85 mg/dL (ref 0.61–1.24)
Glucose, Bld: 93 mg/dL (ref 70–99)
Potassium: 3.7 mmol/L (ref 3.5–5.1)
SODIUM: 139 mmol/L (ref 135–145)

## 2018-02-26 LAB — CBC
HEMATOCRIT: 41.1 % (ref 40.0–52.0)
Hemoglobin: 13.8 g/dL (ref 13.0–18.0)
MCH: 30 pg (ref 26.0–34.0)
MCHC: 33.5 g/dL (ref 32.0–36.0)
MCV: 89.6 fL (ref 80.0–100.0)
PLATELETS: 252 10*3/uL (ref 150–440)
RBC: 4.59 MIL/uL (ref 4.40–5.90)
RDW: 14.3 % (ref 11.5–14.5)
WBC: 7.9 10*3/uL (ref 3.8–10.6)

## 2018-02-26 LAB — HEPARIN LEVEL (UNFRACTIONATED): Heparin Unfractionated: 0.46 IU/mL (ref 0.30–0.70)

## 2018-02-26 LAB — HIV ANTIBODY (ROUTINE TESTING W REFLEX): HIV SCREEN 4TH GENERATION: NONREACTIVE

## 2018-02-26 MED ORDER — FUROSEMIDE 20 MG PO TABS
10.0000 mg | ORAL_TABLET | Freq: Every day | ORAL | Status: DC
  Administered 2018-02-26: 10 mg via ORAL
  Filled 2018-02-26: qty 1

## 2018-02-26 MED ORDER — POTASSIUM CHLORIDE CRYS ER 20 MEQ PO TBCR
20.0000 meq | EXTENDED_RELEASE_TABLET | Freq: Once | ORAL | Status: AC
  Administered 2018-02-26: 20 meq via ORAL
  Filled 2018-02-26: qty 1

## 2018-02-26 MED ORDER — METOPROLOL TARTRATE 50 MG PO TABS
50.0000 mg | ORAL_TABLET | Freq: Two times a day (BID) | ORAL | Status: DC
  Administered 2018-02-26: 50 mg via ORAL
  Filled 2018-02-26: qty 1

## 2018-02-26 MED ORDER — APIXABAN 5 MG PO TABS
5.0000 mg | ORAL_TABLET | Freq: Two times a day (BID) | ORAL | Status: DC
  Administered 2018-02-26 – 2018-03-01 (×7): 5 mg via ORAL
  Filled 2018-02-26 (×7): qty 1

## 2018-02-26 MED ORDER — DILTIAZEM HCL ER 90 MG PO CP12
90.0000 mg | ORAL_CAPSULE | Freq: Two times a day (BID) | ORAL | Status: DC
  Administered 2018-02-26 – 2018-02-27 (×3): 90 mg via ORAL
  Filled 2018-02-26 (×3): qty 1

## 2018-02-26 NOTE — Progress Notes (Signed)
[place] for heparin drip management   Indication: atrial fibrillation  Allergies  Allergen Reactions  . Sulfa Antibiotics     Patient Measurements: Height: 6\' 1"  (185.4 cm) Weight: 235 lb 4.8 oz (106.7 kg) IBW/kg (Calculated) : 79.9 Heparin Dosing Weight: 101  Vital Signs: Temp: 98.4 F (36.9 C) (06/28 1956) Temp Source: Oral (06/28 1956) BP: 105/86 (06/28 2331) Pulse Rate: 79 (06/28 2331)  Labs: Recent Labs    02/25/18 0739 02/25/18 0857 02/25/18 1339 02/25/18 1934 02/26/18 0205  HGB 13.9  --   --   --  13.8  HCT 41.6  --   --   --  41.1  PLT 265  --   --   --  252  APTT 31  --   --   --   --   LABPROT 13.4  --   --   --   --   INR 1.03  --   --   --   --   HEPARINUNFRC  --   --   --  0.36 0.46  CREATININE  --  0.80  --   --  0.85  TROPONINI  --  <0.03 <0.03 <0.03  --     Estimated Creatinine Clearance: 103.6 mL/min (by C-G formula based on SCr of 0.85 mg/dL).   Medical History: Past Medical History:  Diagnosis Date  . Basal cell carcinoma   . Neuropathy   . Obesity   . Osteoarthritis     Medications:  Scheduled:  . furosemide  40 mg Intravenous BID  . metoprolol tartrate  25 mg Oral Q6H  . sodium chloride flush  3 mL Intravenous Q12H   Infusions:  . heparin 1,400 Units/hr (02/26/18 0232)    Assessment: Pharmacy consulted for heparin drip management for 72 yo male admitted with atrial fibrillation. Patient dose not take oral anticoagulation as an outpatient.       Goal of Therapy:  Heparin level 0.3-0.7 units/ml Monitor platelets by anticoagulation protocol: Yes   Plan:  Will bolus heparin 5000 units x 1 and start heparin infusion at 1450 units/hr.   Pharmacy will continue to monitor and adjust per consult.   6/28:  HL @ 19:30 = 0.38 Will continue this pt on current rate and recheck HL in 6 hrs on 6/29 @ 0130.   6/29:  HL @ 0200 = 0.46 Will continue this pt on current rate and recheck HL on 6/30  with AM labs.   Manraj Yeo D 02/26/2018,3:27 AM

## 2018-02-26 NOTE — Progress Notes (Signed)
[facility] [place] NAME: Christopher Vargas    MR#:  [id number]  DATE OF BIRTH:  Dec 03, 1945  SUBJECTIVE:  CHIEF COMPLAINT:   Chief Complaint  Patient presents with  . Chest Pain   No CP. Feels better with SOB Heart rate continues to be in 130s  REVIEW OF SYSTEMS:    Review of Systems  Constitutional: Positive for malaise/fatigue. Negative for chills and fever.  HENT: Negative for sore throat.   Eyes: Negative for blurred vision, double vision and pain.  Respiratory: Negative for cough, hemoptysis, shortness of breath and wheezing.   Cardiovascular: Negative for chest pain, palpitations, orthopnea and leg swelling.  Gastrointestinal: Negative for abdominal pain, constipation, diarrhea, heartburn, nausea and vomiting.  Genitourinary: Negative for dysuria and hematuria.  Musculoskeletal: Negative for back pain and joint pain.  Skin: Negative for rash.  Neurological: Negative for sensory change, speech change, focal weakness and headaches.  Endo/Heme/Allergies: Does not bruise/bleed easily.  Psychiatric/Behavioral: Negative for depression. The patient is not nervous/anxious.     DRUG ALLERGIES:   Allergies  Allergen Reactions  . Sulfa Antibiotics     VITALS:  Blood pressure 107/89, pulse (!) 103, temperature 97.6 F (36.4 C), temperature source Oral, resp. rate 18, height 6\' 1"  (1.854 m), weight 103.9 kg (229 lb 1.6 oz), SpO2 95 %.  PHYSICAL EXAMINATION:   Physical Exam  GENERAL:  72 y.o.-year-old patient lying in the bed with no acute distress.  EYES: Pupils equal, round, reactive to light and accommodation. No scleral icterus. Extraocular muscles intact.  HEENT: Head atraumatic, normocephalic. Oropharynx and nasopharynx clear.  NECK:  Supple, no jugular venous distention. No thyroid enlargement, no tenderness.  LUNGS: Normal breath sounds bilaterally, no wheezing, rales, rhonchi. No use of accessory muscles of respiration.   CARDIOVASCULAR: Irregularly irregular.  Tachycardia. ABDOMEN: Soft, nontender, nondistended. Bowel sounds present. No organomegaly or mass.  EXTREMITIES: No cyanosis, clubbing or edema b/l.    NEUROLOGIC: Cranial nerves II through XII are intact. No focal Motor or sensory deficits b/l. PSYCHIATRIC: The patient is alert and oriented x 3.  SKIN: No obvious rash, lesion, or ulcer.   LABORATORY PANEL:   CBC Recent Labs  Lab 02/26/18 0205  WBC 7.9  HGB 13.8  HCT 41.1  PLT 252   ------------------------------------------------------------------------------------------------------------------ Chemistries  Recent Labs  Lab 02/25/18 0857 02/26/18 0205  NA 137 139  K 4.3 3.7  CL 106 105  CO2 24 26  GLUCOSE 102* 93  BUN 22 21  CREATININE 0.80 0.85  CALCIUM 8.4* 8.5*  MG 2.1  --    ------------------------------------------------------------------------------------------------------------------  Cardiac Enzymes Recent Labs  Lab 02/25/18 1934  TROPONINI <0.03   ------------------------------------------------------------------------------------------------------------------  RADIOLOGY:  Ct Angio Chest Pe W And/or Wo Contrast  Addendum Date: 02/25/2018   ADDENDUM REPORT: 02/25/2018 16:44 CHIEF COMPLAINT: Contrast extravasation into left arm. HISTORY OF PRESENT ILLNESS: 72 year old male presented to the radiology department for a CT of the chest with intravenous contrast. Functioning IV was present in the right forearm. Contrast was injected through the IV with subsequently failed with extravasation of 55 mL of Ominpaque-350 contrast into the right upper arm. ALLERGIES: No allergies to iodinated REVIEW OF SYSTEMS: Limited review of systems. Constitutional: No acute distress Skin: No rashes. No pain. No blistering. Mild bruising at the injection site. Musculoskeletal: No changes in strength.  No pain. Heme/lymph: Denies bruising. Neurologic: No numbness or tingling. PHYSICAL  EXAMINATION: Vitals:  Pulse: 90bpm General: No acute distress. Skin: No  rashes. Skin warm and dry. No erythematous areas. No blistering. No bruising. Extremities: No edema. No cyanosis or clubbing. Capillary refill: 2 seconds Musculoskeletal: 5/5 strength of bilateral hands. Normal range of motion. Neurologic:  Intact sensation. ASSESSMENT AND PLAN: Patient presents for IV contrast extravasation into the right forearm during CT of the chest with intravenous contrast. There is no evidence of compartment syndrome at this time. There is no evidence of vascular compromise or neurologic deficits. PLAN: 1.  Elevate affected extremity (above heart) 2. Utilize ice packs (30-86) minutes applications four times per day. Note - ice to be wrapped in a sheet or towel to reduce the risk of frost bite. 3. If either at the time of extravasation or at check at 5 and 10 minutes, there is a concern of intense pain and decreased sensation, call plastic surgery (general surgery is plastic surgery not available) for an emergent consult. 4. Immediate surgical consult for the following: progressive swelling or pain, altered tissue perfusion as evident by decreased capillary refill at any time after the extravasation has occurred, change in sensation in the affected limb, and skin ulceration or blistering. It is important to note that initial symptoms of a compartment syndrome may be relatively mild (such as limited to the development of focal paresthesia). 5. If no appreciable injury, damage or abnormal patient sensation, then the patient can be discharged, at one hour at the earliest. If there is any uncertainty, then patient should be kept and monitored for 2-4 hours. 6. When discharging, patient instructions must include to return to the emergency department if arm pain, skin blistering or significant abnormal sensation occurs in the injection site and areas distal to it. Electronically Signed   By: Kathreen Devoid   On: 02/25/2018 16:44    Result Date: 02/25/2018 CLINICAL DATA:  Recent long travel. New onset atrial fibrillation. Prior knee replacement. Rule out PE. EXAM: CT ANGIOGRAPHY CHEST WITH CONTRAST TECHNIQUE: Multidetector CT imaging of the chest was performed using the standard protocol during bolus administration of intravenous contrast. Multiplanar CT image reconstructions and MIPs were obtained to evaluate the vascular anatomy. CONTRAST:  36mL OMNIPAQUE IOHEXOL 350 MG/ML SOLN, 89mL OMNIPAQUE IOHEXOL 350 MG/ML SOLN COMPARISON:  None. FINDINGS: Cardiovascular: Heart size is normal. No pericardial effusion. There are focal coronary artery calcifications. The pulmonary arteries are well opacified and there is no acute pulmonary embolus. The thoracic aorta is tortuous but otherwise normal in appearance. Mediastinum/Nodes: The visualized portion of the thyroid gland has a normal appearance. Esophagus is normal in appearance. No mediastinal, hilar, or axillary adenopathy. Lungs/Pleura: There are moderate bilateral pleural effusions. Within the LEFT UPPER lobe there is a 2.2 x 1.3 centimeter spiculated density. Multiple circumscribed pulmonary nodules are identified throughout the lungs, largest identified within the LEFT LOWER lobe, measuring 9 millimeters in diameter. There is diffuse smooth septal wall thickening and patchy areas of airspace filling, consistent with pulmonary edema. Upper Abdomen: There is probable contrast within the collecting system of the LEFT kidney. The gallbladder is present. Musculoskeletal: Significant degenerative changes in the midthoracic spine. Review of the MIP images confirms the above findings. IMPRESSION: 1. Technically adequate exam showing no acute pulmonary embolus. 2. There are bilateral pleural effusions and parenchymal changes consistent with pulmonary edema. 3. Numerous pulmonary nodules throughout the lungs. Although most of these are smooth and circumscribed, largest measuring 9 millimeters, an  irregular mass in the LEFT UPPER lobe is 2.2 x 1.3 centimeters. Considerations postinflammatory process and malignancy. Non-contrast chest CT at 3-6  months is recommended. If nodules persist, subsequent management will be based upon the most suspicious nodule(s). This recommendation follows the consensus statement: Guidelines for Management of Incidental Pulmonary Nodules Detected on CT Images: From the Fleischner Society 2017; Radiology 2017; 284:228-243. 4. Coronary artery calcifications. No cardiomegaly or pericardial effusion. Electronically Signed: By: Nolon Nations M.D. On: 02/25/2018 10:41   ASSESSMENT AND PLAN:   *New onset atrial fibrillation with rapid ventricular rate Continues to be tachycardic.  Metoprolol 50 twice daily.  Added Cardizem.  Appreciate cardiology input.  Stop IV heparin and changed to Eliquis.  *Acute systolic congestive heart failure Ejection fraction 40 to 45%.  On beta-blockers.  ACE inhibitors if blood pressure can tolerate after adding Cardizem. Continue Lasix.  Monitor input and output. Will need outpatient work-up with stress test.   *Pulmonary nodules.  Will need follow-up CT chest in 3 to 6 months with primary care physician.    All the records are reviewed and case discussed with Care Management/Social Worker Management plans discussed with the patient, family and they are in agreement.  CODE STATUS: FULL CODE  DVT Prophylaxis: SCDs  TOTAL TIME TAKING CARE OF THIS PATIENT: 35 minutes.   POSSIBLE D/C IN 1-2 DAYS, DEPENDING ON CLINICAL CONDITION.  Leia Alf Bruno Leach M.D on 02/26/2018 at 12:28 PM  Between 7am to 6pm - Pager - [phone]  After 6pm go to [email or web address] - password [email or web address] [facility]  Office  [phone]  CC: Primary care physician; Guadalupe Maple, MD  Note: This dictation was prepared with Dragon dictation along with smaller phrase technology. Any transcriptional errors that result from this process are  unintentional.

## 2018-02-26 NOTE — Plan of Care (Signed)
  Problem: Activity: Goal: Risk for activity intolerance will decrease Outcome: Progressing   Problem: Nutrition: Goal: Adequate nutrition will be maintained Outcome: Progressing   Problem: Pain Managment: Goal: General experience of comfort will improve Outcome: Progressing   Problem: Safety: Goal: Ability to remain free from injury will improve Outcome: Progressing   Problem: Skin Integrity: Goal: Risk for impaired skin integrity will decrease Outcome: Progressing   Problem: Education: Goal: Knowledge of General Education information will improve Outcome: Completed/Met    Treated once for a headache with tylenol with relief, up independently tolerating well. Continues on heparin gtt.

## 2018-02-26 NOTE — Consult Note (Signed)
ANTICOAGULATION CONSULT NOTE - Initial Consult  Pharmacy Consult for apixaban Indication: atrial fibrillation  Allergies  Allergen Reactions  . Sulfa Antibiotics     Patient Measurements: Height: 6\' 1"  (185.4 cm) Weight: 229 lb 1.6 oz (103.9 kg) IBW/kg (Calculated) : 79.9 Heparin Dosing Weight:   Vital Signs: Temp: 97.6 F (36.4 C) (06/29 0806) Temp Source: Oral (06/29 0806) BP: 107/89 (06/29 0806) Pulse Rate: 103 (06/29 0806)  Labs: Recent Labs    02/25/18 0739 02/25/18 0857 02/25/18 1339 02/25/18 1934 02/26/18 0205  HGB 13.9  --   --   --  13.8  HCT 41.6  --   --   --  41.1  PLT 265  --   --   --  252  APTT 31  --   --   --   --   LABPROT 13.4  --   --   --   --   INR 1.03  --   --   --   --   HEPARINUNFRC  --   --   --  0.36 0.46  CREATININE  --  0.80  --   --  0.85  TROPONINI  --  <0.03 <0.03 <0.03  --     Estimated Creatinine Clearance: 102.4 mL/min (by C-G formula based on SCr of 0.85 mg/dL).   Medical History: Past Medical History:  Diagnosis Date  . Basal cell carcinoma   . Neuropathy   . Obesity   . Osteoarthritis     Medications:  Scheduled:  . apixaban  5 mg Oral BID  . diltiazem  90 mg Oral BID  . furosemide  10 mg Oral Daily  . metoprolol tartrate  50 mg Oral BID  . potassium chloride  20 mEq Oral Once  . sodium chloride flush  3 mL Intravenous Q12H    Assessment: Patient is a 72 year old male with afib, uncontrolled rate. Pt on heparin drip. Pharmacy consulted to transition patient to apixaban.  Goal of Therapy:   Monitor platelets by anticoagulation protocol: Yes   Plan:  Apixaban 5mg  BID. Turn heparin drip off and give first dose. CBC and scr q 3 days while inpatient per policy  Ollivander See D Prentiss Bells, Pharm.D, BCPS Clinical Pharmacist  02/26/2018,10:03 AM

## 2018-02-26 NOTE — Progress Notes (Signed)
Subjective:  Congested No dyspnea , palpitations or chest pain HR not controlled   Objective:  Vitals:   02/25/18 2331 02/26/18 0341 02/26/18 0342 02/26/18 0806  BP: 105/86 (!) 122/98 (!) 122/98 107/89  Pulse: 79 85 89 (!) 103  Resp: 18 18 18    Temp:  97.9 F (36.6 C) 97.9 F (36.6 C) 97.6 F (36.4 C)  TempSrc:  Oral Oral Oral  SpO2: 96% 100% 96% 95%  Weight:   229 lb 1.6 oz (103.9 kg)   Height:        Intake/Output from previous day:  Intake/Output Summary (Last 24 hours) at 02/26/2018 0910 Last data filed at 02/26/2018 7341 Gross per 24 hour  Intake 244.23 ml  Output 2825 ml  Net -2580.77 ml    Physical Exam: Affect appropriate Healthy:  appears stated age HEENT: normal Neck supple with no adenopathy JVP normal no bruits no thyromegaly Lungs clear with no wheezing and good diaphragmatic motion Heart:  S1/S2 SEM murmur, no rub, gallop or click PMI normal Abdomen: benighn, BS positve, no tenderness, no AAA no bruit.  No HSM or HJR Distal pulses intact with no bruits No edema Neuro non-focal Skin warm and dry No muscular weakness   Lab Results: Basic Metabolic Panel: Recent Labs    02/25/18 0857 02/26/18 0205  NA 137 139  K 4.3 3.7  CL 106 105  CO2 24 26  GLUCOSE 102* 93  BUN 22 21  CREATININE 0.80 0.85  CALCIUM 8.4* 8.5*  MG 2.1  --    Liver Function Tests: No results for input(s): AST, ALT, ALKPHOS, BILITOT, PROT, ALBUMIN in the last 72 hours. No results for input(s): LIPASE, AMYLASE in the last 72 hours. CBC: Recent Labs    02/25/18 0739 02/26/18 0205  WBC 7.8 7.9  HGB 13.9 13.8  HCT 41.6 41.1  MCV 90.3 89.6  PLT 265 252   Cardiac Enzymes: Recent Labs    02/25/18 0857 02/25/18 1339 02/25/18 1934  TROPONINI <0.03 <0.03 <0.03   Thyroid Function Tests: Recent Labs    02/25/18 0857  TSH 2.573    Imaging: Ct Angio Chest Pe W And/or Wo Contrast  Addendum Date: 02/25/2018   ADDENDUM REPORT: 02/25/2018 16:44 CHIEF  COMPLAINT: Contrast extravasation into left arm. HISTORY OF PRESENT ILLNESS: 72 year old male presented to the radiology department for a CT of the chest with intravenous contrast. Functioning IV was present in the right forearm. Contrast was injected through the IV with subsequently failed with extravasation of 55 mL of Ominpaque-350 contrast into the right upper arm. ALLERGIES: No allergies to iodinated REVIEW OF SYSTEMS: Limited review of systems. Constitutional: No acute distress Skin: No rashes. No pain. No blistering. Mild bruising at the injection site. Musculoskeletal: No changes in strength.  No pain. Heme/lymph: Denies bruising. Neurologic: No numbness or tingling. PHYSICAL EXAMINATION: Vitals:  Pulse: 90bpm General: No acute distress. Skin: No rashes. Skin warm and dry. No erythematous areas. No blistering. No bruising. Extremities: No edema. No cyanosis or clubbing. Capillary refill: 2 seconds Musculoskeletal: 5/5 strength of bilateral hands. Normal range of motion. Neurologic:  Intact sensation. ASSESSMENT AND PLAN: Patient presents for IV contrast extravasation into the right forearm during CT of the chest with intravenous contrast. There is no evidence of compartment syndrome at this time. There is no evidence of vascular compromise or neurologic deficits. PLAN: 1.  Elevate affected extremity (above heart) 2. Utilize ice packs (93-79) minutes applications four times per day. Note - ice to be  wrapped in a sheet or towel to reduce the risk of frost bite. 3. If either at the time of extravasation or at check at 5 and 10 minutes, there is a concern of intense pain and decreased sensation, call plastic surgery (general surgery is plastic surgery not available) for an emergent consult. 4. Immediate surgical consult for the following: progressive swelling or pain, altered tissue perfusion as evident by decreased capillary refill at any time after the extravasation has occurred, change in sensation in the  affected limb, and skin ulceration or blistering. It is important to note that initial symptoms of a compartment syndrome may be relatively mild (such as limited to the development of focal paresthesia). 5. If no appreciable injury, damage or abnormal patient sensation, then the patient can be discharged, at one hour at the earliest. If there is any uncertainty, then patient should be kept and monitored for 2-4 hours. 6. When discharging, patient instructions must include to return to the emergency department if arm pain, skin blistering or significant abnormal sensation occurs in the injection site and areas distal to it. Electronically Signed   By: Kathreen Devoid   On: 02/25/2018 16:44   Result Date: 02/25/2018 CLINICAL DATA:  Recent long travel. New onset atrial fibrillation. Prior knee replacement. Rule out PE. EXAM: CT ANGIOGRAPHY CHEST WITH CONTRAST TECHNIQUE: Multidetector CT imaging of the chest was performed using the standard protocol during bolus administration of intravenous contrast. Multiplanar CT image reconstructions and MIPs were obtained to evaluate the vascular anatomy. CONTRAST:  68mL OMNIPAQUE IOHEXOL 350 MG/ML SOLN, 49mL OMNIPAQUE IOHEXOL 350 MG/ML SOLN COMPARISON:  None. FINDINGS: Cardiovascular: Heart size is normal. No pericardial effusion. There are focal coronary artery calcifications. The pulmonary arteries are well opacified and there is no acute pulmonary embolus. The thoracic aorta is tortuous but otherwise normal in appearance. Mediastinum/Nodes: The visualized portion of the thyroid gland has a normal appearance. Esophagus is normal in appearance. No mediastinal, hilar, or axillary adenopathy. Lungs/Pleura: There are moderate bilateral pleural effusions. Within the LEFT UPPER lobe there is a 2.2 x 1.3 centimeter spiculated density. Multiple circumscribed pulmonary nodules are identified throughout the lungs, largest identified within the LEFT LOWER lobe, measuring 9 millimeters in  diameter. There is diffuse smooth septal wall thickening and patchy areas of airspace filling, consistent with pulmonary edema. Upper Abdomen: There is probable contrast within the collecting system of the LEFT kidney. The gallbladder is present. Musculoskeletal: Significant degenerative changes in the midthoracic spine. Review of the MIP images confirms the above findings. IMPRESSION: 1. Technically adequate exam showing no acute pulmonary embolus. 2. There are bilateral pleural effusions and parenchymal changes consistent with pulmonary edema. 3. Numerous pulmonary nodules throughout the lungs. Although most of these are smooth and circumscribed, largest measuring 9 millimeters, an irregular mass in the LEFT UPPER lobe is 2.2 x 1.3 centimeters. Considerations postinflammatory process and malignancy. Non-contrast chest CT at 3-6 months is recommended. If nodules persist, subsequent management will be based upon the most suspicious nodule(s). This recommendation follows the consensus statement: Guidelines for Management of Incidental Pulmonary Nodules Detected on CT Images: From the Fleischner Society 2017; Radiology 2017; 284:228-243. 4. Coronary artery calcifications. No cardiomegaly or pericardial effusion. Electronically Signed: By: Nolon Nations M.D. On: 02/25/2018 10:41    Cardiac Studies:  ECG: No orders found for this or any previous visit.   Telemetry:afib rates 95-135  Echo: EF 45-50% mild AS   Medications:   . furosemide  40 mg Intravenous BID  . metoprolol  tartrate  25 mg Oral Q6H  . sodium chloride flush  3 mL Intravenous Q12H     . heparin 1,400 Units/hr (02/26/18 0232)    Assessment/Plan:   AFib:  Transition to eliquis d/c heparin change lopressor to 50 bid and add cardizem for better rate control Symptomatically is stable and could be d/c over weekend but HR not controlled  CHF:  BNP mildly elevated EF 45-50% good diuresis 258 change lasix to daily PO consider adding ACE but  need To control HR first  AS:  Murmur on exam only mild no need for SBE    Jenkins Rouge 02/26/2018, 9:10 AM

## 2018-02-27 LAB — CBC
HEMATOCRIT: 41.9 % (ref 40.0–52.0)
HEMOGLOBIN: 14.7 g/dL (ref 13.0–18.0)
MCH: 31 pg (ref 26.0–34.0)
MCHC: 35 g/dL (ref 32.0–36.0)
MCV: 88.4 fL (ref 80.0–100.0)
PLATELETS: 289 10*3/uL (ref 150–440)
RBC: 4.74 MIL/uL (ref 4.40–5.90)
RDW: 14.3 % (ref 11.5–14.5)
WBC: 10.1 10*3/uL (ref 3.8–10.6)

## 2018-02-27 LAB — BASIC METABOLIC PANEL
ANION GAP: 10 (ref 5–15)
BUN: 21 mg/dL (ref 8–23)
CHLORIDE: 105 mmol/L (ref 98–111)
CO2: 25 mmol/L (ref 22–32)
Calcium: 8.9 mg/dL (ref 8.9–10.3)
Creatinine, Ser: 0.68 mg/dL (ref 0.61–1.24)
GFR calc Af Amer: >60 mL/min (ref >60)
GLUCOSE: 90 mg/dL (ref 70–99)
POTASSIUM: 4.1 mmol/L (ref 3.5–5.1)
Sodium: 140 mmol/L (ref 135–145)

## 2018-02-27 LAB — HEPARIN LEVEL (UNFRACTIONATED): Heparin Unfractionated: 1.56 IU/mL — ABNORMAL HIGH (ref 0.30–0.70)

## 2018-02-27 MED ORDER — FUROSEMIDE 20 MG PO TABS
20.0000 mg | ORAL_TABLET | Freq: Every day | ORAL | Status: DC
  Administered 2018-02-27 – 2018-02-28 (×2): 20 mg via ORAL
  Filled 2018-02-27 (×2): qty 1

## 2018-02-27 MED ORDER — METOPROLOL TARTRATE 50 MG PO TABS
100.0000 mg | ORAL_TABLET | Freq: Two times a day (BID) | ORAL | Status: DC
  Administered 2018-02-27 – 2018-02-28 (×4): 100 mg via ORAL
  Filled 2018-02-27 (×4): qty 2

## 2018-02-27 MED ORDER — DILTIAZEM HCL ER 60 MG PO CP12
120.0000 mg | ORAL_CAPSULE | Freq: Two times a day (BID) | ORAL | Status: DC
  Administered 2018-02-28 (×2): 120 mg via ORAL
  Filled 2018-02-27 (×4): qty 2

## 2018-02-27 NOTE — Progress Notes (Signed)
[place] [facility] [place] NAME: Christopher Vargas    MR#:  [id number]  DATE OF BIRTH:  1945/09/19  SUBJECTIVE:  CHIEF COMPLAINT:   Chief Complaint  Patient presents with  . Chest Pain   Continues to have tachycardia.  Shortness of breath has resolved.  No chest pain.  Ambulating well.  REVIEW OF SYSTEMS:    Review of Systems  Constitutional: Positive for malaise/fatigue. Negative for chills and fever.  HENT: Negative for sore throat.   Eyes: Negative for blurred vision, double vision and pain.  Respiratory: Negative for cough, hemoptysis, shortness of breath and wheezing.   Cardiovascular: Negative for chest pain, palpitations, orthopnea and leg swelling.  Gastrointestinal: Negative for abdominal pain, constipation, diarrhea, heartburn, nausea and vomiting.  Genitourinary: Negative for dysuria and hematuria.  Musculoskeletal: Negative for back pain and joint pain.  Skin: Negative for rash.  Neurological: Negative for sensory change, speech change, focal weakness and headaches.  Endo/Heme/Allergies: Does not bruise/bleed easily.  Psychiatric/Behavioral: Negative for depression. The patient is not nervous/anxious.     DRUG ALLERGIES:   Allergies  Allergen Reactions  . Sulfa Antibiotics     VITALS:  Blood pressure 114/79, pulse (!) 113, temperature 98.6 F (37 C), temperature source Oral, resp. rate 16, height 6\' 1"  (1.854 m), weight 102 kg (224 lb 12.8 oz), SpO2 95 %.  PHYSICAL EXAMINATION:   Physical Exam  GENERAL:  72 y.o.-year-old patient lying in the bed with no acute distress.  EYES: Pupils equal, round, reactive to light and accommodation. No scleral icterus. Extraocular muscles intact.  HEENT: Head atraumatic, normocephalic. Oropharynx and nasopharynx clear.  NECK:  Supple, no jugular venous distention. No thyroid enlargement, no tenderness.  LUNGS: Normal breath sounds bilaterally, no wheezing, rales, rhonchi. No use of  accessory muscles of respiration.  CARDIOVASCULAR: Irregularly irregular.  Tachycardia. ABDOMEN: Soft, nontender, nondistended. Bowel sounds present. No organomegaly or mass.  EXTREMITIES: No cyanosis, clubbing or edema b/l.    NEUROLOGIC: Cranial nerves II through XII are intact. No focal Motor or sensory deficits b/l. PSYCHIATRIC: The patient is alert and oriented x 3.  SKIN: No obvious rash, lesion, or ulcer.   LABORATORY PANEL:   CBC Recent Labs  Lab 02/27/18 0532  WBC 10.1  HGB 14.7  HCT 41.9  PLT 289   ------------------------------------------------------------------------------------------------------------------ Chemistries  Recent Labs  Lab 02/25/18 0857  02/27/18 0532  NA 137   < > 140  K 4.3   < > 4.1  CL 106   < > 105  CO2 24   < > 25  GLUCOSE 102*   < > 90  BUN 22   < > 21  CREATININE 0.80   < > 0.68  CALCIUM 8.4*   < > 8.9  MG 2.1  --   --    < > = values in this interval not displayed.   ------------------------------------------------------------------------------------------------------------------  Cardiac Enzymes Recent Labs  Lab 02/25/18 1934  TROPONINI <0.03   ------------------------------------------------------------------------------------------------------------------  RADIOLOGY:  No results found. ASSESSMENT AND PLAN:   *New onset atrial fibrillation with rapid ventricular rate Continues to be tachycardic.   Metoprolol dose increased.  Continue Cardizem.  Appreciate cardiology input.  On Eliquis  *Acute systolic congestive heart failure Ejection fraction 40 to 45%.  On beta-blockers.  ACE inhibitors if blood pressure can tolerate after adding Cardizem. Continue Lasix orally.  Monitor input and output. Will need outpatient work-up with stress test.   *Pulmonary nodules.  Will need  follow-up CT chest in 3 to 6 months with primary care physician.   All the records are reviewed and case discussed with Care Management/Social  Worker Management plans discussed with the patient, family and they are in agreement.  CODE STATUS: FULL CODE  DVT Prophylaxis: SCDs  TOTAL TIME TAKING CARE OF THIS PATIENT: 35 minutes.   POSSIBLE D/C IN 1-2 DAYS, DEPENDING ON CLINICAL CONDITION.  Leia Alf Kartik Fernando M.D on 02/27/2018 at 1:14 PM  Between 7am to 6pm - Pager - [phone]  After 6pm go to [email or web address] - password [email or web address] [facility]  Office  [phone]  CC: Primary care physician; Guadalupe Maple, MD  Note: This dictation was prepared with Dragon dictation along with smaller phrase technology. Any transcriptional errors that result from this process are unintentional.

## 2018-02-27 NOTE — Progress Notes (Signed)
Subjective:  Sister in visiting from [place] Resting HR 100-110 goes up to 150 walking  Objective:  Vitals:   02/26/18 0806 02/26/18 1614 02/26/18 2128 02/27/18 0404  BP: 107/89 118/82 102/72 126/81  Pulse: (!) 103 77 67 (!) 103  Resp:  18 17   Temp: 97.6 F (36.4 C) 97.9 F (36.6 C) 98.4 F (36.9 C) 98 F (36.7 C)  TempSrc: Oral Oral Oral Oral  SpO2: 95% 97% 97% 96%  Weight:    224 lb 12.8 oz (102 kg)  Height:        Intake/Output from previous day:  Intake/Output Summary (Last 24 hours) at 02/27/2018 0902 Last data filed at 02/27/2018 0405 Gross per 24 hour  Intake -  Output 2375 ml  Net -2375 ml    Physical Exam: Affect appropriate Healthy:  appears stated age HEENT: normal Neck supple with no adenopathy JVP normal no bruits no thyromegaly Lungs clear with no wheezing and good diaphragmatic motion Heart:  S1/S2 SEM murmur, no rub, gallop or click PMI normal Abdomen: benighn, BS positve, no tenderness, no AAA no bruit.  No HSM or HJR Distal pulses intact with no bruits No edema Neuro non-focal Skin warm and dry No muscular weakness   Lab Results: Basic Metabolic Panel: Recent Labs    02/25/18 0857 02/26/18 0205 02/27/18 0532  NA 137 139 140  K 4.3 3.7 4.1  CL 106 105 105  CO2 24 26 25   GLUCOSE 102* 93 90  BUN 22 21 21   CREATININE 0.80 0.85 0.68  CALCIUM 8.4* 8.5* 8.9  MG 2.1  --   --    Liver Function Tests: No results for input(s): AST, ALT, ALKPHOS, BILITOT, PROT, ALBUMIN in the last 72 hours. No results for input(s): LIPASE, AMYLASE in the last 72 hours. CBC: Recent Labs    02/26/18 0205 02/27/18 0532  WBC 7.9 10.1  HGB 13.8 14.7  HCT 41.1 41.9  MCV 89.6 88.4  PLT 252 289   Cardiac Enzymes: Recent Labs    02/25/18 0857 02/25/18 1339 02/25/18 1934  TROPONINI <0.03 <0.03 <0.03   Thyroid Function Tests: Recent Labs    02/25/18 0857  TSH 2.573    Imaging: Ct Angio Chest Pe W And/or Wo Contrast  Addendum Date:  02/25/2018   ADDENDUM REPORT: 02/25/2018 16:44 CHIEF COMPLAINT: Contrast extravasation into left arm. HISTORY OF PRESENT ILLNESS: 72 year old male presented to the radiology department for a CT of the chest with intravenous contrast. Functioning IV was present in the right forearm. Contrast was injected through the IV with subsequently failed with extravasation of 55 mL of [place]-350 contrast into the right upper arm. ALLERGIES: No allergies to iodinated REVIEW OF SYSTEMS: Limited review of systems. Constitutional: No acute distress Skin: No rashes. No pain. No blistering. Mild bruising at the injection site. Musculoskeletal: No changes in strength.  No pain. Heme/lymph: Denies bruising. Neurologic: No numbness or tingling. PHYSICAL EXAMINATION: Vitals:  Pulse: 90bpm General: No acute distress. Skin: No rashes. Skin warm and dry. No erythematous areas. No blistering. No bruising. Extremities: No edema. No cyanosis or clubbing. Capillary refill: 2 seconds Musculoskeletal: 5/5 strength of bilateral hands. Normal range of motion. Neurologic:  Intact sensation. ASSESSMENT AND PLAN: Patient presents for IV contrast extravasation into the right forearm during CT of the chest with intravenous contrast. There is no evidence of compartment syndrome at this time. There is no evidence of vascular compromise or neurologic deficits. PLAN: 1.  Elevate affected extremity (above heart) 2.  Utilize ice packs (08-65) minutes applications four times per day. Note - ice to be wrapped in a sheet or towel to reduce the risk of frost bite. 3. If either at the time of extravasation or at check at 5 and 10 minutes, there is a concern of intense pain and decreased sensation, call plastic surgery (general surgery is plastic surgery not available) for an emergent consult. 4. Immediate surgical consult for the following: progressive swelling or pain, altered tissue perfusion as evident by decreased capillary refill at any time after the  extravasation has occurred, change in sensation in the affected limb, and skin ulceration or blistering. It is important to note that initial symptoms of a compartment syndrome may be relatively mild (such as limited to the development of focal paresthesia). 5. If no appreciable injury, damage or abnormal patient sensation, then the patient can be discharged, at one hour at the earliest. If there is any uncertainty, then patient should be kept and monitored for 2-4 hours. 6. When discharging, patient instructions must include to return to the emergency department if arm pain, skin blistering or significant abnormal sensation occurs in the injection site and areas distal to it. Electronically Signed   By: Kathreen Devoid   On: 02/25/2018 16:44   Result Date: 02/25/2018 CLINICAL DATA:  Recent long travel. New onset atrial fibrillation. Prior knee replacement. Rule out PE. EXAM: CT ANGIOGRAPHY CHEST WITH CONTRAST TECHNIQUE: Multidetector CT imaging of the chest was performed using the standard protocol during bolus administration of intravenous contrast. Multiplanar CT image reconstructions and MIPs were obtained to evaluate the vascular anatomy. CONTRAST:  73mL OMNIPAQUE IOHEXOL 350 MG/ML SOLN, 31mL OMNIPAQUE IOHEXOL 350 MG/ML SOLN COMPARISON:  None. FINDINGS: Cardiovascular: Heart size is normal. No pericardial effusion. There are focal coronary artery calcifications. The pulmonary arteries are well opacified and there is no acute pulmonary embolus. The thoracic aorta is tortuous but otherwise normal in appearance. Mediastinum/Nodes: The visualized portion of the thyroid gland has a normal appearance. Esophagus is normal in appearance. No mediastinal, hilar, or axillary adenopathy. Lungs/Pleura: There are moderate bilateral pleural effusions. Within the LEFT UPPER lobe there is a 2.2 x 1.3 centimeter spiculated density. Multiple circumscribed pulmonary nodules are identified throughout the lungs, largest identified  within the LEFT LOWER lobe, measuring 9 millimeters in diameter. There is diffuse smooth septal wall thickening and patchy areas of airspace filling, consistent with pulmonary edema. Upper Abdomen: There is probable contrast within the collecting system of the LEFT kidney. The gallbladder is present. Musculoskeletal: Significant degenerative changes in the midthoracic spine. Review of the MIP images confirms the above findings. IMPRESSION: 1. Technically adequate exam showing no acute pulmonary embolus. 2. There are bilateral pleural effusions and parenchymal changes consistent with pulmonary edema. 3. Numerous pulmonary nodules throughout the lungs. Although most of these are smooth and circumscribed, largest measuring 9 millimeters, an irregular mass in the LEFT UPPER lobe is 2.2 x 1.3 centimeters. Considerations postinflammatory process and malignancy. Non-contrast chest CT at 3-6 months is recommended. If nodules persist, subsequent management will be based upon the most suspicious nodule(s). This recommendation follows the consensus statement: Guidelines for Management of Incidental Pulmonary Nodules Detected on CT Images: From the Fleischner Society 2017; Radiology 2017; 284:228-243. 4. Coronary artery calcifications. No cardiomegaly or pericardial effusion. Electronically Signed: By: Nolon Nations M.D. On: 02/25/2018 10:41    Cardiac Studies:  ECG: No orders found for this or any previous visit.   Telemetry:afib rates 95-135  Echo: EF 45-50% mild AS  Medications:   . apixaban  5 mg Oral BID  . diltiazem  90 mg Oral BID  . furosemide  20 mg Oral Daily  . metoprolol tartrate  50 mg Oral BID  . sodium chloride flush  3 mL Intravenous Q12H       Assessment/Plan:   AFib:  On eliquis now will increase lopressor and cardizem dosing If rate controlled in am d/c for elective  Cardioversion if not TEE/DCC pending Dr Tyrell Antonio schedule will make NPO   CHF:  BNP mildly elevated EF 45-50% on oral  lasix now consider adding ACE as outpatient Have favored titrating AV nodal agents for afib rather than adding ACE at this point   AS:  Murmur on exam only mild no need for SBE    Jenkins Rouge 02/27/2018, 9:02 AM

## 2018-02-28 MED ORDER — SODIUM CHLORIDE 0.9 % IV SOLN
INTRAVENOUS | Status: DC
  Administered 2018-03-01: 06:00:00 via INTRAVENOUS

## 2018-02-28 MED ORDER — SODIUM CHLORIDE 0.9 % IV SOLN: 250.0000 mL | INTRAVENOUS | Status: DC

## 2018-02-28 MED ORDER — SODIUM CHLORIDE 0.9% FLUSH
3.0000 mL | Freq: Two times a day (BID) | INTRAVENOUS | Status: DC
  Administered 2018-02-28: 3 mL via INTRAVENOUS

## 2018-02-28 MED ORDER — FUROSEMIDE 20 MG PO TABS
10.0000 mg | ORAL_TABLET | Freq: Every day | ORAL | Status: DC
  Administered 2018-03-01: 10 mg via ORAL
  Filled 2018-02-28: qty 1

## 2018-02-28 MED ORDER — SODIUM CHLORIDE 0.9% FLUSH: 3.0000 mL | INTRAVENOUS | Status: DC | PRN

## 2018-02-28 MED ORDER — DILTIAZEM HCL 30 MG PO TABS
30.0000 mg | ORAL_TABLET | Freq: Four times a day (QID) | ORAL | Status: DC
  Administered 2018-02-28 – 2018-03-01 (×4): 30 mg via ORAL
  Filled 2018-02-28 (×4): qty 1

## 2018-02-28 NOTE — Progress Notes (Signed)
Progress Note  Patient Name: Christopher Vargas Date of Encounter: 02/28/2018  Primary Cardiologist: New to [facility] - consult by Fletcher Anon   Subjective   Remains in Afib with RVR with heart rates in the 110s to 130s at rest. With ambulation in the hallway ventricular rates peaking into the 170s bpm. No SOB, palpitations, or chest pain.   Inpatient Medications    Scheduled Meds: . apixaban  5 mg Oral BID  . diltiazem  120 mg Oral BID  . furosemide  20 mg Oral Daily  . metoprolol tartrate  100 mg Oral BID  . sodium chloride flush  3 mL Intravenous Q12H   Continuous Infusions:  PRN Meds: acetaminophen **OR** acetaminophen, albuterol, metoprolol tartrate, ondansetron **OR** ondansetron (ZOFRAN) IV, polyethylene glycol   Vital Signs    Vitals:   02/27/18 0404 02/27/18 0800 02/27/18 1920 02/28/18 0527  BP: 126/81 114/79 100/74 (!) 121/94  Pulse: (!) 103 (!) 113 79 74  Resp:  16 17   Temp: 98 F (36.7 C) 98.6 F (37 C) 97.7 F (36.5 C) 98.1 F (36.7 C)  TempSrc: Oral Oral Oral Oral  SpO2: 96% 95% 95% 94%  Weight: 224 lb 12.8 oz (102 kg)   223 lb 1.6 oz (101.2 kg)  Height:        Intake/Output Summary (Last 24 hours) at 02/28/2018 0810 Last data filed at 02/28/2018 0529 Gross per 24 hour  Intake -  Output 2150 ml  Net -2150 ml   Filed Weights   02/26/18 0342 02/27/18 0404 02/28/18 0527  Weight: 229 lb 1.6 oz (103.9 kg) 224 lb 12.8 oz (102 kg) 223 lb 1.6 oz (101.2 kg)    Telemetry    Afib with RVR, 110s to 170s bpm - Personally Reviewed  ECG    n/a - Personally Reviewed  Physical Exam   GEN: No acute distress.   Neck: No JVD. Cardiac: Tachycardic, irregularly irregular, I/VI systolic murmur RUSB, no rubs, or gallops.  Respiratory: Clear to auscultation bilaterally.  GI: Soft, nontender, non-distended.   MS: No edema; No deformity. Neuro:  Alert and oriented x 3; Nonfocal.  Psych: Normal affect.  Labs    Chemistry Recent Labs  Lab 02/25/18 0857  02/26/18 0205 02/27/18 0532  NA 137 139 140  K 4.3 3.7 4.1  CL 106 105 105  CO2 24 26 25   GLUCOSE 102* 93 90  BUN 22 21 21   CREATININE 0.80 0.85 0.68  CALCIUM 8.4* 8.5* 8.9  GFRNONAA >60 >60 >60  GFRAA >60 >60 >60  ANIONGAP 7 8 10      Hematology Recent Labs  Lab 02/25/18 0739 02/26/18 0205 02/27/18 0532  WBC 7.8 7.9 10.1  RBC 4.61 4.59 4.74  HGB 13.9 13.8 14.7  HCT 41.6 41.1 41.9  MCV 90.3 89.6 88.4  MCH 30.2 30.0 31.0  MCHC 33.4 33.5 35.0  RDW 14.9* 14.3 14.3  PLT 265 252 289    Cardiac Enzymes Recent Labs  Lab 02/25/18 0857 02/25/18 1339 02/25/18 1934  TROPONINI <0.03 <0.03 <0.03   No results for input(s): TROPIPOC in the last 168 hours.   BNP Recent Labs  Lab 02/25/18 0739  BNP 229.0*     DDimer No results for input(s): DDIMER in the last 168 hours.   Radiology    No results found.  Cardiac Studies   TTE 02/25/2018: Study Conclusions  - Left ventricle: The cavity size was normal. There was mild   concentric hypertrophy. Systolic function was mildly reduced.  The   estimated ejection fraction was in the range of 45% to 50%.   Diffuse hypokinesis. - Aortic valve: There was very mild stenosis. Mean gradient (S): 6   mm Hg. Valve area (VTI): 1.75 cm^2. - Mitral valve: There was mild regurgitation. - Right atrium: The atrium was mildly dilated.  Patient Profile     72 y.o. male with history of basal cell carcinoma of the right cheek, osteoarthritis of the right knee, and obesity who is being seen today for the evaluation of new onset A. fib with RVR and acute systolic CHF.  Assessment & Plan    1. New onset Afib with RVR: -Remains in Afib with ventricular rates remain difficult to control, in the 120s to 130s bpm, into the 170s bpm with ambulation in the hallway -Add short acting diltiazem 30 mg q 6 hours -Continue Cardizem CD 120 gm daily -Continue Lopressor 100 mg bid -Eliquis 5 mg bid (does not meet 2/3 reduced dosing  criteria) -CHADS2VASc at least 3 (CHF, age x 1, vascular disease) -If his ventricular rates remain difficult to control, will plan for TEE/DCCV on the morning of 7/2 (STEMI in a different patient precludes ability to perform this today) -If his ventricular rates can be well controlled/patient is asymptomatic with ambulation could pursue DCCV as an outpatient in ~ 3-4 weeks after he has been adequately anticoagulated   2. Acute systolic CHF: -He does not appear grossly volume up at this time -Lasix 20 mg daily -Lopressor as above -Favor escalation of rate-controlling medications at this time given persistence in tachycardic rates over ACEi/sprionolactone  -Ideally, taper Cardizem CD when able given his cardiomyopathy, however, his persistently elevated ventricular rates require its usage at this time -Look to escalate evidence-based heart failure therapy following improvement in ventricular rates -Possibly tachy-mediated, plan to repeat echo in ~ 1 month following improvement in rate/rhythm, if EF remains low at that time, plan for nuclear stress testing -Daily weights, strict Is and Os  3. Mild aortic stenosis: -Noted on TTE as above -Follow as an outpatient   For questions or updates, please contact [place] Please consult [email or web address] for contact info under Cardiology/STEMI.    Signed, Christell Faith, PA-C [place] Pager: [phone] 02/28/2018, 8:10 AM

## 2018-02-28 NOTE — Progress Notes (Signed)
Order to hold evening dose of diltiazem 30mg  per Thurmond Butts , [place] due to low BP / will monitor

## 2018-02-28 NOTE — Care Management Important Message (Signed)
Copy of signed IM left with patient in room.  

## 2018-02-28 NOTE — Progress Notes (Signed)
HR 150-160s with ambulation / [place], [place] made aware/ new orders entered- scheduled for cardioversion 7/2/ will continue to monitor

## 2018-02-28 NOTE — Care Management (Signed)
Patient to discharge home on Eliquis.  Patient verbally confirms pharmacy coverage with his medicare plan.  He is interested in information on copay for this medication.  Provided patient with 30 day trial coupon.  Uses [employer].  patient has a 95 dollar deductible for medications which has not been met.  Patient's will pay 142.50 for first month after using the coupon then copay will be 47 dollar a month.  Informed patient and there are no concerns.

## 2018-02-28 NOTE — Progress Notes (Signed)
[place] at [place] NAME: Christopher Vargas    MR#:  [id number]  DATE OF BIRTH:  [birth year]  SUBJECTIVE:  CHIEF COMPLAINT:   Chief Complaint  Patient presents with  . Chest Pain   Continues to have tachycardia. SOB has resolved.  REVIEW OF SYSTEMS:    Review of Systems  Constitutional: Positive for malaise/fatigue. Negative for chills and fever.  HENT: Negative for sore throat.   Eyes: Negative for blurred vision, double vision and pain.  Respiratory: Negative for cough, hemoptysis, shortness of breath and wheezing.   Cardiovascular: Negative for chest pain, palpitations, orthopnea and leg swelling.  Gastrointestinal: Negative for abdominal pain, constipation, diarrhea, heartburn, nausea and vomiting.  Genitourinary: Negative for dysuria and hematuria.  Musculoskeletal: Negative for back pain and joint pain.  Skin: Negative for rash.  Neurological: Negative for sensory change, speech change, focal weakness and headaches.  Endo/Heme/Allergies: Does not bruise/bleed easily.  Psychiatric/Behavioral: Negative for depression. The patient is not nervous/anxious.    DRUG ALLERGIES:   Allergies  Allergen Reactions  . Sulfa Antibiotics    VITALS:  Blood pressure 92/77, pulse 99, temperature 98.5 F (36.9 C), resp. rate 18, height 6\' 1"  (1.854 m), weight 101.2 kg (223 lb 1.6 oz), SpO2 96 %.  PHYSICAL EXAMINATION:   Physical Exam  GENERAL:  72 y.o.-year-old patient lying in the bed with no acute distress.  EYES: Pupils equal, round, reactive to light and accommodation. No scleral icterus. Extraocular muscles intact.  HEENT: Head atraumatic, normocephalic. Oropharynx and nasopharynx clear.  NECK:  Supple, no jugular venous distention. No thyroid enlargement, no tenderness.  LUNGS: Normal breath sounds bilaterally, no wheezing, rales, rhonchi. No use of accessory muscles of respiration.  CARDIOVASCULAR: Irregularly irregular.   Tachycardia. ABDOMEN: Soft, nontender, nondistended. Bowel sounds present. No organomegaly or mass.  EXTREMITIES: No cyanosis, clubbing or edema b/l.    NEUROLOGIC: Cranial nerves II through XII are intact. No focal Motor or sensory deficits b/l. PSYCHIATRIC: The patient is alert and oriented x 3.  SKIN: No obvious rash, lesion, or ulcer.   LABORATORY PANEL:   CBC Recent Labs  Lab 02/27/18 0532  WBC 10.1  HGB 14.7  HCT 41.9  PLT 289   ------------------------------------------------------------------------------------------------------------------ Chemistries  Recent Labs  Lab 02/25/18 0857  02/27/18 0532  NA 137   < > 140  K 4.3   < > 4.1  CL 106   < > 105  CO2 24   < > 25  GLUCOSE 102*   < > 90  BUN 22   < > 21  CREATININE 0.80   < > 0.68  CALCIUM 8.4*   < > 8.9  MG 2.1  --   --    < > = values in this interval not displayed.   ------------------------------------------------------------------------------------------------------------------  Cardiac Enzymes Recent Labs  Lab 02/25/18 1934  TROPONINI <0.03   ------------------------------------------------------------------------------------------------------------------  RADIOLOGY:  No results found. ASSESSMENT AND PLAN:   *New onset atrial fibrillation with rapid ventricular rate Continues to be tachycardic.   Metoprolol dose increased.  Continue Cardizem. Additional cardizem added  Appreciate cardiology input.  On Eliquis TEE cardioversion tomorrow  *Acute systolic congestive heart failure Ejection fraction 40 to 45%.  On beta-blockers.  ACE inhibitors if blood pressure can tolerate after adding Cardizem. Continue Lasix orally but decrease to 10 mg daily.  Monitor input and output. Will need outpatient work-up with stress test.  *Pulmonary nodules.  Will need follow-up CT chest in 3  to 6 months with primary care physician.  All the records are reviewed and case discussed with Care Management/Social  Worker Management plans discussed with the patient, family and they are in agreement.  CODE STATUS: FULL CODE  DVT Prophylaxis: SCDs  TOTAL TIME TAKING CARE OF THIS PATIENT: 35 minutes.   POSSIBLE D/C IN 1-2 DAYS, DEPENDING ON CLINICAL CONDITION.  Christopher Vargas M.D on 02/28/2018 at 12:50 PM  Between 7am to 6pm - Pager - [phone]  After 6pm go to [email or web address] - password [email or web address] [place] [facility]  Office  [phone]  CC: Primary care physician; Guadalupe Maple, MD  Note: This dictation was prepared with Dragon dictation along with smaller phrase technology. Any transcriptional errors that result from this process are unintentional.

## 2018-02-28 NOTE — H&P (View-Only) (Signed)
Progress Note  Patient Name: Christopher Vargas Date of Encounter: 02/28/2018  Primary Cardiologist: New to [facility] - consult by Fletcher Anon   Subjective   Remains in Afib with RVR with heart rates in the 110s to 130s at rest. With ambulation in the hallway ventricular rates peaking into the 170s bpm. No SOB, palpitations, or chest pain.   Inpatient Medications    Scheduled Meds: . apixaban  5 mg Oral BID  . diltiazem  120 mg Oral BID  . furosemide  20 mg Oral Daily  . metoprolol tartrate  100 mg Oral BID  . sodium chloride flush  3 mL Intravenous Q12H   Continuous Infusions:  PRN Meds: acetaminophen **OR** acetaminophen, albuterol, metoprolol tartrate, ondansetron **OR** ondansetron (ZOFRAN) IV, polyethylene glycol   Vital Signs    Vitals:   02/27/18 0404 02/27/18 0800 02/27/18 1920 02/28/18 0527  BP: 126/81 114/79 100/74 (!) 121/94  Pulse: (!) 103 (!) 113 79 74  Resp:  16 17   Temp: 98 F (36.7 C) 98.6 F (37 C) 97.7 F (36.5 C) 98.1 F (36.7 C)  TempSrc: Oral Oral Oral Oral  SpO2: 96% 95% 95% 94%  Weight: 224 lb 12.8 oz (102 kg)   223 lb 1.6 oz (101.2 kg)  Height:        Intake/Output Summary (Last 24 hours) at 02/28/2018 0810 Last data filed at 02/28/2018 0529 Gross per 24 hour  Intake -  Output 2150 ml  Net -2150 ml   Filed Weights   02/26/18 0342 02/27/18 0404 02/28/18 0527  Weight: 229 lb 1.6 oz (103.9 kg) 224 lb 12.8 oz (102 kg) 223 lb 1.6 oz (101.2 kg)    Telemetry    Afib with RVR, 110s to 170s bpm - Personally Reviewed  ECG    n/a - Personally Reviewed  Physical Exam   GEN: No acute distress.   Neck: No JVD. Cardiac: Tachycardic, irregularly irregular, I/VI systolic murmur RUSB, no rubs, or gallops.  Respiratory: Clear to auscultation bilaterally.  GI: Soft, nontender, non-distended.   MS: No edema; No deformity. Neuro:  Alert and oriented x 3; Nonfocal.  Psych: Normal affect.  Labs    Chemistry Recent Labs  Lab 02/25/18 0857  02/26/18 0205 02/27/18 0532  NA 137 139 140  K 4.3 3.7 4.1  CL 106 105 105  CO2 24 26 25   GLUCOSE 102* 93 90  BUN 22 21 21   CREATININE 0.80 0.85 0.68  CALCIUM 8.4* 8.5* 8.9  GFRNONAA >60 >60 >60  GFRAA >60 >60 >60  ANIONGAP 7 8 10      Hematology Recent Labs  Lab 02/25/18 0739 02/26/18 0205 02/27/18 0532  WBC 7.8 7.9 10.1  RBC 4.61 4.59 4.74  HGB 13.9 13.8 14.7  HCT 41.6 41.1 41.9  MCV 90.3 89.6 88.4  MCH 30.2 30.0 31.0  MCHC 33.4 33.5 35.0  RDW 14.9* 14.3 14.3  PLT 265 252 289    Cardiac Enzymes Recent Labs  Lab 02/25/18 0857 02/25/18 1339 02/25/18 1934  TROPONINI <0.03 <0.03 <0.03   No results for input(s): TROPIPOC in the last 168 hours.   BNP Recent Labs  Lab 02/25/18 0739  BNP 229.0*     DDimer No results for input(s): DDIMER in the last 168 hours.   Radiology    No results found.  Cardiac Studies   TTE 02/25/2018: Study Conclusions  - Left ventricle: The cavity size was normal. There was mild   concentric hypertrophy. Systolic function was mildly reduced.  The   estimated ejection fraction was in the range of 45% to 50%.   Diffuse hypokinesis. - Aortic valve: There was very mild stenosis. Mean gradient (S): 6   mm Hg. Valve area (VTI): 1.75 cm^2. - Mitral valve: There was mild regurgitation. - Right atrium: The atrium was mildly dilated.  Patient Profile     72 y.o. male with history of basal cell carcinoma of the right cheek, osteoarthritis of the right knee, and obesity who is being seen today for the evaluation of new onset A. fib with RVR and acute systolic CHF.  Assessment & Plan    1. New onset Afib with RVR: -Remains in Afib with ventricular rates remain difficult to control, in the 120s to 130s bpm, into the 170s bpm with ambulation in the hallway -Add short acting diltiazem 30 mg q 6 hours -Continue Cardizem CD 120 gm daily -Continue Lopressor 100 mg bid -Eliquis 5 mg bid (does not meet 2/3 reduced dosing  criteria) -CHADS2VASc at least 3 (CHF, age x 1, vascular disease) -If his ventricular rates remain difficult to control, will plan for TEE/DCCV on the morning of 7/2 (STEMI in a different patient precludes ability to perform this today) -If his ventricular rates can be well controlled/patient is asymptomatic with ambulation could pursue DCCV as an outpatient in ~ 3-4 weeks after he has been adequately anticoagulated   2. Acute systolic CHF: -He does not appear grossly volume up at this time -Lasix 20 mg daily -Lopressor as above -Favor escalation of rate-controlling medications at this time given persistence in tachycardic rates over ACEi/sprionolactone  -Ideally, taper Cardizem CD when able given his cardiomyopathy, however, his persistently elevated ventricular rates require its usage at this time -Look to escalate evidence-based heart failure therapy following improvement in ventricular rates -Possibly tachy-mediated, plan to repeat echo in ~ 1 month following improvement in rate/rhythm, if EF remains low at that time, plan for nuclear stress testing -Daily weights, strict Is and Os  3. Mild aortic stenosis: -Noted on TTE as above -Follow as an outpatient   For questions or updates, please contact [place] Please consult [email or web address] for contact info under Cardiology/STEMI.    Signed, Christell Faith, PA-C [place] Pager: [phone] 02/28/2018, 8:10 AM

## 2018-03-01 ENCOUNTER — Inpatient Hospital Stay: Payer: Medicare HMO | Admitting: Anesthesiology

## 2018-03-01 ENCOUNTER — Encounter
Admission: EM | Disposition: A | Discharge status: Home / Self Care | Payer: Self-pay | Source: Home / Self Care | Attending: Internal Medicine

## 2018-03-01 ENCOUNTER — Encounter: Payer: Self-pay | Admitting: Anesthesiology

## 2018-03-01 ENCOUNTER — Inpatient Hospital Stay (HOSPITAL_COMMUNITY)
Admit: 2018-03-01 | Discharge: 2018-03-01 | Disposition: A | Discharge status: Home / Self Care | Payer: Medicare HMO | Attending: Physician Assistant | Admitting: Physician Assistant

## 2018-03-01 ENCOUNTER — Telehealth: Payer: Self-pay | Admitting: Internal Medicine

## 2018-03-01 DIAGNOSIS — I5031 Acute diastolic (congestive) heart failure: Secondary | ICD-10-CM

## 2018-03-01 DIAGNOSIS — I4891 Unspecified atrial fibrillation: Secondary | ICD-10-CM

## 2018-03-01 DIAGNOSIS — Q231 Congenital insufficiency of aortic valve: Secondary | ICD-10-CM

## 2018-03-01 DIAGNOSIS — I350 Nonrheumatic aortic (valve) stenosis: Secondary | ICD-10-CM

## 2018-03-01 HISTORY — PX: TEE WITHOUT CARDIOVERSION: SHX5443

## 2018-03-01 HISTORY — PX: CARDIOVERSION: EP1203

## 2018-03-01 SURGERY — CARDIOVERSION (CATH LAB)
Anesthesia: General

## 2018-03-01 SURGERY — ECHOCARDIOGRAM, TRANSESOPHAGEAL
Anesthesia: Monitor Anesthesia Care

## 2018-03-01 MED ORDER — BUTAMBEN-TETRACAINE-BENZOCAINE 2-2-14 % EX AERO
INHALATION_SPRAY | CUTANEOUS | Status: AC
  Filled 2018-03-01: qty 5

## 2018-03-01 MED ORDER — PROPOFOL 10 MG/ML IV BOLUS
INTRAVENOUS | Status: DC | PRN
  Administered 2018-03-01 (×2): 30 mg via INTRAVENOUS
  Administered 2018-03-01: 60 mg via INTRAVENOUS
  Administered 2018-03-01: 30 mg via INTRAVENOUS

## 2018-03-01 MED ORDER — METOPROLOL TARTRATE 50 MG PO TABS
50.0000 mg | ORAL_TABLET | Freq: Two times a day (BID) | ORAL | Status: DC
  Filled 2018-03-01: qty 1

## 2018-03-01 MED ORDER — FUROSEMIDE 20 MG PO TABS: 10.0000 mg | ORAL_TABLET | Freq: Every day | ORAL | 0 refills | Status: DC

## 2018-03-01 MED ORDER — APIXABAN 5 MG PO TABS: 5.0000 mg | ORAL_TABLET | Freq: Two times a day (BID) | ORAL | 0 refills | Status: DC

## 2018-03-01 MED ORDER — LIDOCAINE VISCOUS HCL 2 % MT SOLN
OROMUCOSAL | Status: AC
  Filled 2018-03-01: qty 15

## 2018-03-01 MED ORDER — METOPROLOL TARTRATE 50 MG PO TABS: 50.0000 mg | ORAL_TABLET | Freq: Two times a day (BID) | ORAL | 0 refills | Status: DC

## 2018-03-01 MED ORDER — PHENYLEPHRINE HCL 10 MG/ML IJ SOLN
INTRAMUSCULAR | Status: DC | PRN
  Administered 2018-03-01 (×3): 100 ug via INTRAVENOUS

## 2018-03-01 NOTE — Progress Notes (Signed)
Pt returns from TEE with cardioversion. VSS, no complaints of pain, SB on telemetry with rate of 56. I will continue to assess.

## 2018-03-01 NOTE — Anesthesia Preprocedure Evaluation (Signed)
Anesthesia Evaluation  Patient identified by MRN, date of birth, ID band Patient awake    Reviewed: Allergy & Precautions, H&P , NPO status , Patient's Chart, lab work & pertinent test results  Airway Mallampati: III  TM Distance: >3 FB Neck ROM: full    Dental  (+) Chipped, Poor Dentition   Pulmonary neg pulmonary ROS, neg shortness of breath,           Cardiovascular Exercise Tolerance: Good (-) angina(-) Past MI and (-) DOE + dysrhythmias Atrial Fibrillation      Neuro/Psych negative neurological ROS  negative psych ROS   GI/Hepatic negative GI ROS, Neg liver ROS, neg GERD  ,  Endo/Other  negative endocrine ROS  Renal/GU negative Renal ROS  negative genitourinary   Musculoskeletal  (+) Arthritis ,   Abdominal   Peds  Hematology negative hematology ROS (+)   Anesthesia Other Findings Past Medical History: No date: Basal cell carcinoma No date: Neuropathy No date: Obesity No date: Osteoarthritis  Past Surgical History: No date: KNEE SURGERY; Bilateral No date: REPLACEMENT TOTAL KNEE; Right No date: TONSILLECTOMY  BMI    Body Mass Index:  29.29 kg/m      Reproductive/Obstetrics negative OB ROS                             Anesthesia Physical Anesthesia Plan  ASA: IV  Anesthesia Plan: General and MAC   Post-op Pain Management:    Induction: Intravenous  PONV Risk Score and Plan: Propofol infusion, TIVA and Midazolam  Airway Management Planned: Natural Airway and Nasal Cannula  Additional Equipment:   Intra-op Plan:   Post-operative Plan:   Informed Consent: I have reviewed the patients History and Physical, chart, labs and discussed the procedure including the risks, benefits and alternatives for the proposed anesthesia with the patient or authorized representative who has indicated his/her understanding and acceptance.   Dental Advisory Given  Plan Discussed  with: Anesthesiologist, CRNA and Surgeon  Anesthesia Plan Comments: (Consented for MAC for TEE and GA for cardioversion   Patient consented for risks of anesthesia including but not limited to:  - adverse reactions to medications - risk of intubation if required - damage to teeth, lips or other oral mucosa - sore throat or hoarseness - Damage to heart, brain, lungs or loss of life  Patient voiced understanding.)        Anesthesia Quick Evaluation

## 2018-03-01 NOTE — Transfer of Care (Signed)
Immediate Anesthesia Transfer of Care Note  Patient: Christopher Vargas  Procedure(s) Performed: TRANSESOPHAGEAL ECHOCARDIOGRAM (TEE) (N/A )  Patient Location: Short Stay  Anesthesia Type:General  Level of Consciousness: sedated  Airway & Oxygen Therapy: Patient connected to nasal cannula oxygen  Post-op Assessment: Post -op Vital signs reviewed and stable  Post vital signs: stable  Last Vitals:  Vitals Value Taken Time  BP 88/65 03/01/2018  8:07 AM  Temp    Pulse 58 03/01/2018  8:12 AM  Resp 21 03/01/2018  8:12 AM  SpO2 94 % 03/01/2018  8:12 AM  Vitals shown include unvalidated device data.  Last Pain:  Vitals:   03/01/18 0725  TempSrc: Oral  PainSc: 0-No pain      Patients Stated Pain Goal: 0 (09/09/01 4961)  Complications: No apparent anesthesia complications

## 2018-03-01 NOTE — Interval H&P Note (Signed)
History and Physical Interval Note:  03/01/2018 7:50 AM  Christopher Vargas  has presented today for TEE/cardioversion, with the diagnosis of atrial fibrillation with rapid ventricular response  The various methods of treatment have been discussed with the patient and family. After consideration of risks, benefits and other options for treatment, the patient has consented to  Procedure(s): TRANSESOPHAGEAL ECHOCARDIOGRAM (TEE) (N/A) as a surgical intervention .  The patient's history has been reviewed, patient examined, no change in status, stable for surgery.  I have reviewed the patient's chart and labs.  Questions were answered to the patient's satisfaction.     Larose Batres

## 2018-03-01 NOTE — CV Procedure (Signed)
    Cardioversion Note  Christopher Vargas [id number] [birth year]  Procedure: DC Cardioversion Indications: Atrial fibrillation  Procedure Details Consent: Obtained Time Out: Verified patient identification, verified procedure, site/side was marked, verified correct patient position, special equipment/implants available, Radiology Safety Procedures followed,  medications/allergies/relevent history reviewed, required imaging and test results available.  Performed  The patient has been on adequate anticoagulation.  TEE immediately before cardioversion showed no intracardiac thrombus.  The patient received IV propofol by aneshesia for sedation.  Synchronous cardioversion was performed at 120 joules x 1.  The cardioversion was successful with restoration of sinus rhythm.  Complications: No apparent complications Patient did tolerate procedure well.  Nelva Bush., MD 03/01/2018, 8:10 AM

## 2018-03-01 NOTE — Progress Notes (Signed)
*  PRELIMINARY RESULTS* Echocardiogram Echocardiogram Transesophageal has been performed.  Sherrie Sport 03/01/2018, 8:09 AM

## 2018-03-01 NOTE — Telephone Encounter (Signed)
TCM....  Patient is being discharged later today  They saw Dr Delorse Limber   They are scheduled to see Dr End  on 3:20 pm   They were seen for afib   They need to be seen within 1 week   Pt is not on wait list   Please call

## 2018-03-01 NOTE — CV Procedure (Signed)
    Transesophageal Echocardiogram Note  LAMARR FEENSTRA [id number] 10-09-1945  Procedure: Transesophageal Echocardiogram Indications: Persistent atrial fibrillation  Procedure Details Consent: Obtained Time Out: Verified patient identification, verified procedure, site/side was marked, verified correct patient position, special equipment/implants available, Radiology Safety Procedures followed,  medications/allergies/relevent history reviewed, required imaging and test results available.  Performed  Medications:  During this procedure the patient is administered propofol by anesthesia services.  Left Ventrical:  Normal size.  Mildly reduced LVEF.  Mitral Valve: Normal.  Trivial MR.  Aortic Valve: Bicuspid.  No AI.  Tricuspid Valve: Normal with mild TR.  Pulmonic Valve: Normal with mild PR.  Left Atrium/ Left atrial appendage: No thrombus.  Atrial septum: Intact by color Doppler  Aorta: Normal descending aorta.   Complications: No apparent complications Patient did tolerate procedure well.   Nelva Bush, MD 03/01/2018, 8:08 AM

## 2018-03-01 NOTE — Anesthesia Post-op Follow-up Note (Signed)
Anesthesia QCDR form completed.        

## 2018-03-01 NOTE — Anesthesia Postprocedure Evaluation (Signed)
Anesthesia Post Note  Patient: Christopher Vargas  Procedure(s) Performed: TRANSESOPHAGEAL ECHOCARDIOGRAM (TEE) (N/A )  Patient location during evaluation: PACU Anesthesia Type: MAC Level of consciousness: awake and alert Pain management: pain level controlled Vital Signs Assessment: post-procedure vital signs reviewed and stable Respiratory status: spontaneous breathing, nonlabored ventilation, respiratory function stable and patient connected to nasal cannula oxygen Cardiovascular status: blood pressure returned to baseline and stable Postop Assessment: no apparent nausea or vomiting Anesthetic complications: no     Last Vitals:  Vitals:   03/01/18 0900 03/01/18 0917  BP: 105/69 108/80  Pulse: (!) 59 (!) 52  Resp: 18   Temp:  36.5 C  SpO2: 97% 97%    Last Pain:  Vitals:   03/01/18 0917  TempSrc: Oral  PainSc:                  Precious Haws [name]

## 2018-03-01 NOTE — Progress Notes (Signed)
Progress Note  Patient Name: Christopher Vargas Date of Encounter: 03/01/2018  Primary Cardiologist: Reola Calkins - Fletcher Anon.  Subjective   Feels well.  No chest pain or shortness of breath.  Inpatient Medications    Scheduled Meds: . apixaban  5 mg Oral BID  . butamben-tetracaine-benzocaine      . furosemide  10 mg Oral Daily  . lidocaine      . metoprolol tartrate  50 mg Oral BID  . sodium chloride flush  3 mL Intravenous Q12H  . sodium chloride flush  3 mL Intravenous Q12H   Continuous Infusions: . sodium chloride     PRN Meds: acetaminophen **OR** acetaminophen, albuterol, metoprolol tartrate, ondansetron **OR** ondansetron (ZOFRAN) IV, polyethylene glycol, sodium chloride flush   Vital Signs    Vitals:   03/01/18 0830 03/01/18 0845 03/01/18 0900 03/01/18 0917  BP: 96/77 103/71 105/69 108/80  Pulse: 67 61 (!) 59 (!) 52  Resp: 17 19 18    Temp:    97.7 F (36.5 C)  TempSrc:    Oral  SpO2: 95% 96% 97% 97%  Weight:      Height:        Intake/Output Summary (Last 24 hours) at 03/01/2018 0922 Last data filed at 03/01/2018 0300 Gross per 24 hour  Intake -  Output 1050 ml  Net -1050 ml   Filed Weights   02/28/18 0527 03/01/18 0351 03/01/18 0725  Weight: 223 lb 1.6 oz (101.2 kg) 222 lb 11.2 oz (101 kg) 222 lb (100.7 kg)    Telemetry    Sinus bradycardia following DCCV.  Prior to cardioversion, a-fib with HR in 70's to 170's - Personally Reviewed  ECG    NSR with PAC's, LAFB, and non-specific T-wave abnormality - Personally Reviewed  Physical Exam   GEN: No acute distress.   Neck: No JVD Cardiac: Bradycardic but regular with occasional extrasystoles.  1/6 systolic murmur.  No rubs or gallops.  Respiratory: Clear to auscultation bilaterally. GI: Soft, nontender, non-distended  MS: No edema; No deformity. Neuro:  Nonfocal  Psych: Normal affect   Labs    Chemistry Recent Labs  Lab 02/25/18 0857 02/26/18 0205 02/27/18 0532  NA 137 139 140  K 4.3 3.7 4.1  CL  106 105 105  CO2 24 26 25   GLUCOSE 102* 93 90  BUN 22 21 21   CREATININE 0.80 0.85 0.68  CALCIUM 8.4* 8.5* 8.9  GFRNONAA >60 >60 >60  GFRAA >60 >60 >60  ANIONGAP 7 8 10      Hematology Recent Labs  Lab 02/25/18 0739 02/26/18 0205 02/27/18 0532  WBC 7.8 7.9 10.1  RBC 4.61 4.59 4.74  HGB 13.9 13.8 14.7  HCT 41.6 41.1 41.9  MCV 90.3 89.6 88.4  MCH 30.2 30.0 31.0  MCHC 33.4 33.5 35.0  RDW 14.9* 14.3 14.3  PLT 265 252 289    Cardiac Enzymes Recent Labs  Lab 02/25/18 0857 02/25/18 1339 02/25/18 1934  TROPONINI <0.03 <0.03 <0.03   No results for input(s): TROPIPOC in the last 168 hours.   BNP Recent Labs  Lab 02/25/18 0739  BNP 229.0*     DDimer No results for input(s): DDIMER in the last 168 hours.   Radiology    No results found.  Cardiac Studies   TEE (03/01/18): No LA/LAA thrombus.  Mildly reduced LVEF.  Bicuspid aortic valve without AI.  Trivial MR.  Mild TR and PR.  Patient Profile     72 y.o. male  basal cell carcinoma of the  right cheek, osteoarthritis of the right knee, and obesitywho is being seen today for the evaluation of new onset A. fib with RVR and acute CHF.  Assessment & Plan    Atrial fibrillation Rate controlled proved difficulty with metoprolol and diltiazem.  He underwent successful TEE-guided cardioversion with restoration of sinus rhythm.  Hold diltiazem and decrease metoprolol tartrate to 50 mg BID given bradycardia following DCCV.  Continue apixaban 5 mg BID.  Acute HFpEF LVEF slightly reduced by echo, likely related to recent a-fib with RVR.  Patient appears euvolemic on exam today.  Continue metoprolol tartrate 50 mg BID.  Discontinue furosemide.  Consider adding ACEI/ARB if blood pressure allows.  Bicuspid aortic valve with mild stenosis I doubt presenting symptoms were to to AS (likely due to a-fib with RVR and CHF).  Continue outpatient follow-up/surveillance.  [facility] will sign off.   Medication  Recommendations:  Continue metoprolol and apixaban, as outlined above Other recommendations (labs, testing, etc):  None Follow up as an outpatient:  1-2 weeks with Dr. Fletcher Anon or APP.  For questions or updates, please contact [place] Please consult [email or web address] for contact info under [facility] Cardiology   Signed, Nelva Bush, MD  03/01/2018, 9:22 AM

## 2018-03-02 ENCOUNTER — Encounter: Payer: Self-pay | Admitting: Internal Medicine

## 2018-03-02 ENCOUNTER — Telehealth: Payer: Self-pay

## 2018-03-02 NOTE — Telephone Encounter (Signed)
Patient contacted regarding discharge from [facility] on Tuesday 03/01/18.  Patient understands to follow up with provider Christell Faith, PA on Monday 03/14/18 at 2:00 pm at the [place] office. Patient understands discharge instructions? Yes Patient understands medications and regiment? Yes Patient understands to bring all medications to this visit? Yes

## 2018-03-02 NOTE — Telephone Encounter (Signed)
Transition Care Management Follow-up Telephone Call  How have you been since you were released from the hospital? "doing very well today, trying to stay out of the heat."  Do you understand why you were in the hospital? yes  Do you have a copy of your discharge instructions Yes Do you understand the discharge instrcutions? yes  Where were you discharged to? Home  Do you have support at home? Yes    Items Reviewed:  Medications obtained Yes  Medications reviewed: Yes  Dietary changes reviewed: yes  Home Health? N/A  DME ordered at discharge obtained? NA,  Medical supplies: NA    Functional Questionnaire:   Activities of Daily Living (ADLs):   He states they are independent in the following: ambulation, bathing and hygiene, feeding, continence, grooming, toileting, dressing and medication management States they require assistance with the following: none  Any transportation issues/concerns?: no  Any patient concerns? no  Confirmed importance and date/time of follow-up visits scheduled with PCP: yes, 03/11/2018 2:00pm with Wynona Dove.   Confirm appointment scheduled with specialist? Yes  Confirmed with patient if condition begins to worsen call PCP or If it's emergency go to the ER.

## 2018-03-04 ENCOUNTER — Encounter: Payer: Self-pay | Admitting: Internal Medicine

## 2018-03-09 ENCOUNTER — Ambulatory Visit: Payer: Medicare HMO | Admitting: Internal Medicine

## 2018-03-10 ENCOUNTER — Inpatient Hospital Stay: Payer: Medicare HMO | Admitting: Family Medicine

## 2018-03-10 NOTE — Discharge Summary (Signed)
[facility] NAME: Christopher Vargas    MR#:  [id number]  DATE OF BIRTH:  1946-08-19  DATE OF ADMISSION:  02/25/2018 ADMITTING PHYSICIAN: Hillary Bow, MD  DATE OF DISCHARGE: 03/01/2018 12:30 PM  PRIMARY CARE PHYSICIAN: Guadalupe Maple, MD   ADMISSION DIAGNOSIS:  Acute pulmonary edema ([place]) [J81.0] Pulmonary nodules [R91.8] Atrial fibrillation with rapid ventricular response ([place]) [I48.91]  DISCHARGE DIAGNOSIS:  Active Problems:   Atrial fibrillation with rapid ventricular response ([place])   SECONDARY DIAGNOSIS:   Past Medical History:  Diagnosis Date  . Basal cell carcinoma   . Neuropathy   . Obesity   . Osteoarthritis      ADMITTING HISTORY  HISTORY OF PRESENT ILLNESS:  Christopher Vargas  is a 72 y.o. male with a known history of remote back pain not on any home medications presents to the hospital complaining of a few days of chest congestion, shortness of breath and palpitations.  He also noticed mild lower committee swelling.  Here in the emergency room patient was found to have atrial fibrillation with tachycardia.  Was given Cardizem IV with some improvement.  But his heart rate has returned to the 140s at this time.  CT scan of the chest was done due to his recent road trip.  No pulmonary embolism has been found, pulmonary edema present.Has some pulmonary nodules     HOSPITAL COURSE:   *New onset atrial fibrillation with rapid ventricular rate.  Patient had tachycardia in spite of being on amiodarone, Cardizem and metoprolol.  Admitted to telemetry floor.  Seen by cardiology.  Echocardiogram showed no thrombus.  Started on heparin drip initially and transition to Eliquis.  TEE cardioversion with patient in normal sinus rhythm by day of discharge and being discharged home on amiodarone and rate control medications with Eliquis.  We will follow-up with cardiology in 1 week.  *Acute systolic congestive heart failure.   Ejection fraction 40 to 45%.  Started on beta-blockers.  ACE inhibitor was not started due to low normal blood pressure.  Patient diuresed well with IV Lasix and transition to oral Lasix by day of discharge.  BUN and creatinine and potassium monitored and stable.  Patient will have outpatient work-up with a stress test with cardiology.  *Pulmonary nodules on CT scan of the chest done in the emergency room.  Patient will need CT chest in 3 to 6 months for follow-up with primary care physician.  Patient discharged home in stable condition.  Normal sinus rhythm at time of discharge.  CONSULTS OBTAINED:  Treatment Team:  Wellington Hampshire, MD  DRUG ALLERGIES:   Allergies  Allergen Reactions  . Sulfa Antibiotics     DISCHARGE MEDICATIONS:   Allergies as of 03/01/2018      Reactions   Sulfa Antibiotics       Medication List    STOP taking these medications   azithromycin 250 MG tablet Commonly known as:  ZITHROMAX   benzonatate 100 MG capsule Commonly known as:  TESSALON   chlorpheniramine-HYDROcodone 10-8 MG/5ML [place]er Commonly known as:  TUSSIONEX PENNKINETIC ER   meloxicam 15 MG tablet Commonly known as:  MOBIC     TAKE these medications   apixaban 5 MG Tabs tablet Commonly known as:  ELIQUIS Take 1 tablet (5 mg total) by mouth 2 (two) times daily.   furosemide 20 MG tablet Commonly known as:  LASIX Take 0.5 tablets (10 mg total) by mouth daily.   metoprolol tartrate  50 MG tablet Commonly known as:  LOPRESSOR Take 1 tablet (50 mg total) by mouth 2 (two) times daily.       Today   VITAL SIGNS:  Blood pressure 108/80, pulse (!) 52, temperature 97.7 F (36.5 C), temperature source Oral, resp. rate 18, height 6\' 1"  (1.854 m), weight 100.7 kg (222 lb), SpO2 97 %.  I/O:  No intake or output data in the 24 hours ending 03/10/18 1655  PHYSICAL EXAMINATION:  Physical Exam  GENERAL:  72 y.o.-year-old patient lying in the bed with no acute distress.  LUNGS: Normal  breath sounds bilaterally, no wheezing, rales,rhonchi or crepitation. No use of accessory muscles of respiration.  CARDIOVASCULAR: S1, S2 normal. No murmurs, rubs, or gallops.  ABDOMEN: Soft, non-tender, non-distended. Bowel sounds present. No organomegaly or mass.  NEUROLOGIC: Moves all 4 extremities. PSYCHIATRIC: The patient is alert and oriented x 3.  SKIN: No obvious rash, lesion, or ulcer.   DATA REVIEW:   CBC No results for input(s): WBC, HGB, HCT, PLT in the last 168 hours.  Chemistries  No results for input(s): NA, K, CL, CO2, GLUCOSE, BUN, CREATININE, CALCIUM, MG, AST, ALT, ALKPHOS, BILITOT in the last 168 hours.  Invalid input(s): GFRCGP  Cardiac Enzymes No results for input(s): TROPONINI in the last 168 hours.  Microbiology Results  No results found for this or any previous visit.  RADIOLOGY:  No results found.  Follow up with PCP in 1 week.  Management plans discussed with the patient, family and they are in agreement.  CODE STATUS:  Code Status History    Date Active Date Inactive Code Status Order ID Comments User Context   02/25/2018 1134 03/01/2018 1624 Full Code [id number]  Hillary Bow, MD ED    Advance Directive Documentation     Most Recent Value  Type of Advance Directive  Living will, Healthcare Power of Attorney  Pre-existing out of facility DNR order (yellow form or pink MOST form)  -  "MOST" Form in Place?  -      TOTAL TIME TAKING CARE OF THIS PATIENT ON DAY OF DISCHARGE: more than 30 minutes.   Neita Carp M.D on 03/10/2018 at 4:55 PM  Between 7am to 6pm - Pager - [phone]  After 6pm go to [email or web address] - password [email or web address] [facility]  Office  [phone]  CC: Primary care physician; Guadalupe Maple, MD  Note: This dictation was prepared with Dragon dictation along with smaller phrase technology. Any transcriptional errors that result from this process are unintentional.

## 2018-03-11 ENCOUNTER — Ambulatory Visit (INDEPENDENT_AMBULATORY_CARE_PROVIDER_SITE_OTHER): Payer: Medicare HMO | Admitting: Physician Assistant

## 2018-03-11 ENCOUNTER — Encounter: Payer: Self-pay | Admitting: Physician Assistant

## 2018-03-11 VITALS — BP 105/63 | HR 62 | Temp 98.2°F | Ht 73.0 in | Wt 227.7 lb

## 2018-03-11 DIAGNOSIS — I509 Heart failure, unspecified: Secondary | ICD-10-CM | POA: Diagnosis not present

## 2018-03-11 DIAGNOSIS — R918 Other nonspecific abnormal finding of lung field: Secondary | ICD-10-CM

## 2018-03-11 DIAGNOSIS — Z09 Encounter for follow-up examination after completed treatment for conditions other than malignant neoplasm: Secondary | ICD-10-CM | POA: Diagnosis not present

## 2018-03-11 DIAGNOSIS — I4891 Unspecified atrial fibrillation: Secondary | ICD-10-CM | POA: Diagnosis not present

## 2018-03-11 NOTE — Progress Notes (Signed)
Subjective:    Patient ID: Christopher Vargas, male    DOB: Nov 04, 1945, 72 y.o.   MRN: [id number]  Christopher Vargas is a 72 y.o. male presenting on 03/11/2018 for Hospitalization Follow-up (Patient states he's feeling better. Has F/U appointment with cardiology 03/14/2018. Patient states he gets most of his care from the [place].)   HPI   Patient was admitted to the hospital on 02/25/2018 with acute pulmonary edema, new onset a-fib with RVR, and acute heart failure.   He arrived to the ER feeling unwell and was found to be in afib with RVR. His Ct angio chest on 02/25/2018 which did not reveal acute PE. There were some bilateral pleural effusions and an incidental finding of pulmonary nodules with recommendation for non contrast CT in 3-6 months. EKG did not show signs of ischemia. Patient was given IV diltiazem and IV lasix with some improvement and he was admitted. He was additionally started on heparin drip. Despite additional metoprolol, rate proved difficult to control and the patient underwent TEE guided cardioversion on 03/01/2018.   TEE revealed mildly reduced EF 40-45% but no thrombus as well as bicuspid aortic valves. Patient was seen by Dr. Saunders Revel, who though that patient's heart failure was brought on by A-fib. He underwent successful cardioversion on 03/01/2018 and maintained sinus rhythm for remainder of stay. Patient was transitioned to Eliquis 5 mg BID, metoprolol. Dr. Saunders Revel recommended discontinuation of Lasix.   Today the patient reports feeling well. He denies chest pain, SOB, lower extremity edema. He continues to take Lasix, Metoprolol, and Eliquis,  The patient asks about the safest pain medication to take at this time because he usually takes Naproxen.   Social History   Tobacco Use  . Smoking status: Never Smoker  . Smokeless tobacco: Never Used  Substance Use Topics  . Alcohol use: Yes  . Drug use: No    Review of Systems Per HPI unless specifically indicated above       Objective:    BP 105/63 (BP Location: Left Arm, Patient Position: Sitting, Cuff Size: Normal)   Pulse 62   Temp 98.2 F (36.8 C) (Tympanic)   Ht 6\' 1"  (1.854 m)   Wt 227 lb 11.2 oz (103.3 kg)   SpO2 98%   BMI 30.04 kg/m   Wt Readings from Last 3 Encounters:  03/11/18 227 lb 11.2 oz (103.3 kg)  03/01/18 222 lb (100.7 kg)  08/25/16 242 lb (109.8 kg)    Physical Exam  Constitutional: He is oriented to person, place, and time. He appears well-developed and well-nourished.  Cardiovascular: Normal rate, regular rhythm and normal heart sounds.  No murmur heard. Pulmonary/Chest: Effort normal and breath sounds normal. He has no wheezes. He has no rales.  Neurological: He is alert and oriented to person, place, and time.  Skin: Skin is warm and dry.  Psychiatric: He has a normal mood and affect. His behavior is normal.   Results for orders placed or performed during the hospital encounter of 02/25/18  CBC  Result Value Ref Range   WBC 7.8 3.8 - 10.6 K/uL   RBC 4.61 4.40 - 5.90 MIL/uL   Hemoglobin 13.9 13.0 - 18.0 g/dL   HCT 41.6 40.0 - 52.0 %   MCV 90.3 80.0 - 100.0 fL   MCH 30.2 26.0 - 34.0 pg   MCHC 33.4 32.0 - 36.0 g/dL   RDW 14.9 (H) 11.5 - 14.5 %   Platelets 265 150 - 440 K/uL  Protime-INR  Result Value Ref Range   Prothrombin Time 13.4 11.4 - 15.2 seconds   INR 1.03   APTT  Result Value Ref Range   aPTT 31 24 - 36 seconds  Brain natriuretic peptide  Result Value Ref Range   B Natriuretic Peptide 229.0 (H) 0.0 - 100.0 pg/mL  Basic metabolic panel  Result Value Ref Range   Sodium 137 135 - 145 mmol/L   Potassium 4.3 3.5 - 5.1 mmol/L   Chloride 106 98 - 111 mmol/L   CO2 24 22 - 32 mmol/L   Glucose, Bld 102 (H) 70 - 99 mg/dL   BUN 22 8 - 23 mg/dL   Creatinine, Ser 0.80 0.61 - 1.24 mg/dL   Calcium 8.4 (L) 8.9 - 10.3 mg/dL   GFR calc non Af Amer >60 >60 mL/min   GFR calc Af Amer >60 >60 mL/min   Anion gap 7 5 - 15  Troponin I  Result Value Ref Range   Troponin I  <0.03 <0.03 ng/mL  Troponin I  Result Value Ref Range   Troponin I <0.03 <0.03 ng/mL  Troponin I  Result Value Ref Range   Troponin I <0.03 <0.03 ng/mL  TSH  Result Value Ref Range   TSH 2.573 0.350 - 4.500 uIU/mL  Magnesium  Result Value Ref Range   Magnesium 2.1 1.7 - 2.4 mg/dL  HIV antibody  Result Value Ref Range   HIV Screen 4th Generation wRfx Non Reactive Non Reactive  Heparin level (unfractionated)  Result Value Ref Range   Heparin Unfractionated 0.36 0.30 - 0.70 IU/mL  Basic metabolic panel  Result Value Ref Range   Sodium 139 135 - 145 mmol/L   Potassium 3.7 3.5 - 5.1 mmol/L   Chloride 105 98 - 111 mmol/L   CO2 26 22 - 32 mmol/L   Glucose, Bld 93 70 - 99 mg/dL   BUN 21 8 - 23 mg/dL   Creatinine, Ser 0.85 0.61 - 1.24 mg/dL   Calcium 8.5 (L) 8.9 - 10.3 mg/dL   GFR calc non Af Amer >60 >60 mL/min   GFR calc Af Amer >60 >60 mL/min   Anion gap 8 5 - 15  CBC  Result Value Ref Range   WBC 7.9 3.8 - 10.6 K/uL   RBC 4.59 4.40 - 5.90 MIL/uL   Hemoglobin 13.8 13.0 - 18.0 g/dL   HCT 41.1 40.0 - 52.0 %   MCV 89.6 80.0 - 100.0 fL   MCH 30.0 26.0 - 34.0 pg   MCHC 33.5 32.0 - 36.0 g/dL   RDW 14.3 11.5 - 14.5 %   Platelets 252 150 - 440 K/uL  Heparin level (unfractionated)  Result Value Ref Range   Heparin Unfractionated 0.46 0.30 - 0.70 IU/mL  Heparin level (unfractionated)  Result Value Ref Range   Heparin Unfractionated 1.56 (H) 0.30 - 0.70 IU/mL  CBC  Result Value Ref Range   WBC 10.1 3.8 - 10.6 K/uL   RBC 4.74 4.40 - 5.90 MIL/uL   Hemoglobin 14.7 13.0 - 18.0 g/dL   HCT 41.9 40.0 - 52.0 %   MCV 88.4 80.0 - 100.0 fL   MCH 31.0 26.0 - 34.0 pg   MCHC 35.0 32.0 - 36.0 g/dL   RDW 14.3 11.5 - 14.5 %   Platelets 289 150 - 440 K/uL  Basic metabolic panel  Result Value Ref Range   Sodium 140 135 - 145 mmol/L   Potassium 4.1 3.5 - 5.1 mmol/L   Chloride 105 98 - 111  mmol/L   CO2 25 22 - 32 mmol/L   Glucose, Bld 90 70 - 99 mg/dL   BUN 21 8 - 23 mg/dL   Creatinine,  Ser 0.68 0.61 - 1.24 mg/dL   Calcium 8.9 8.9 - 10.3 mg/dL   GFR calc non Af Amer >60 >60 mL/min   GFR calc Af Amer >60 >60 mL/min   Anion gap 10 5 - 15  ECHOCARDIOGRAM COMPLETE  Result Value Ref Range   Weight 3,680 oz   Height 73 in   BP 115/89 mmHg      Assessment & Plan:   1. Hospital discharge follow-up  I have reviewed the H&P and discharge summary regarding his most recent hospitalization. I have reviewed all available labwork and imaging. Do not feel patient needs additional labwork at this time. Have instructed patient to discontinue Lasix per Dr. Darnelle Bos recommendations.  2. Atrial fibrillation with rapid ventricular response (HCC)  NSR today after successful cardioversion. Patient remains on metoprolol, BP is low normal. Patient did reports some occasional dizziness taking this medication.   3. Acute heart failure, unspecified heart failure type (HCC)  Mildly reduced EF, bicuspid aortic valve. Following up with Christell Faith, PA-C on 03/14/2018.  4. Pulmonary nodules  Incidental finding. Patient has a PCP at the [facility] as he is a English as a second language teacher and says he would prefer to get the follow up CT scan at this facility. Have printed out image report for patient and recommendations for non-contrast CT follow up in 3-6 months. He may need to get CDs from medical records to compare imaging at the [place].   Follow up plan: Return if symptoms worsen or fail to improve.  Carles Collet, PA-C [facility] 03/11/2018, 2:45 PM

## 2018-03-11 NOTE — Progress Notes (Signed)
Cardiology Office Note Date:  03/14/2018  Patient ID:  Christopher Vargas 1946-04-08, MRN [id number] PCP:  Guadalupe Maple, MD  Cardiologist:  Dr. Fletcher Anon, MD    Chief Complaint: Hospital follow up  History of Present Illness: Christopher Vargas is a 72 y.o. male with history of recently diagnosed Afib in 01/2018 s/p successful DCCV 03/01/2018 on Eliquis, chronic combined CHF, bicuspid aortic valve with mild aortic stenosis, mildly dilated aortic root and ascending aorta, pulmonary nodules, obesity, and BCC who presents for hospital follow up after recent admission to [facility] from 6/28 to 7/2 for new onset Afib with RVR and acute diastolic CHF.   Prior to the above admission the patient did not have any previously known cardiac history. Patient had recently gone on a cruise out of [place] prior to his admission. While on the cruise he began to develop symptoms that he thought were related to a URI, eventually leading up to increased SOB and orthopnea. Upon arriving back in [place], he developed tachy-palpitations on 6/27 with worsening orthopnea. He presented to [facility] on 6/28 and was noted to be in new onset Afib with RVR with heart rates into the 150s bpm and was volume overloaded. CTA chest was negative for PE, though did show bilateral pleural effusions and numerous pulmonary nodules. He was rate controlled and started on anticoagulation with IV heparin initially. Echo on 6/28 showed an EF of 45-50%, diffuse HK, very mild aortic stenosis with a mean gradient of 6 mmHg, mild MR, mildly dilated left atrium. Cardiac enzymes remained negative. He was diuresed with an eventual discharge weight of 222 pounds (admission weight 230 pounds). Prior to discharge he underwent successful TEE/DCCV on 7/2. Of note, TEE demonstrated a bicuspid aortic valve with mild stenosis and no AI. He was mildly bradycardic following his DCCV leading to the holding of diltiazem and decreasing of metoprolol to 50 mg bid at discharge. He  was continued on Eliquis.   He comes in doing well today. Remains in sinus rhythm. 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 He does note mild increased SOB with significant exertion. No chest pain or palpitations. Heart rate has remained well controlled at rest and with exertion his heart rate has peaked in the 130s bpm. Prior to his admission his heart rate would peak in the 120s to 130s bpm. Weight has been trending up at home since his discharge. He feels like this is in the setting of increased calories intake. No lower extremity swelling, abdominal distension, PND, cough, SOB, or early satiety. He has not missed any doses of Eliquis. No falls, BRBPR, melena, hematuria, hemoptysis, or hematemesis.   Past Medical History:  Diagnosis Date  . Basal cell carcinoma   . Bicuspid aortic valve    a. TEE 03/01/2018: EF 45-50%, no evidence of thrombus, bicuspid aortic valve with mildly clacified leaflets, mildly dilated aortic root (44 mm) and ascending aorta (38 mm)   . Chronic combined systolic and diastolic CHF (congestive heart failure) ([place])    a. Echo 6/19: EF 45-50%, diffuse HK, mild AS with a mean gradient of 6 mmHg, mild MR, mildly dilated LA  . Neuropathy   . Obesity   . Osteoarthritis   . PAF (paroxysmal atrial fibrillation) ([place])    a. diagnosed 02/25/18; b. status post TEE/DCCV 03/01/18; c. CHADS2VASc => 3 (CHF, age x 1, vascular disease); d. Eliquis    Past Surgical History:  Procedure Laterality Date  . CARDIOVERSION N/A 03/01/2018  Procedure: CARDIOVERSION;  Surgeon: Nelva Bush, MD;  Location: [facility] ORS;  Service: Cardiovascular;  Laterality: N/A;  . KNEE SURGERY Bilateral   . REPLACEMENT TOTAL KNEE Right   . TEE WITHOUT CARDIOVERSION N/A 03/01/2018   Procedure: TRANSESOPHAGEAL ECHOCARDIOGRAM (TEE);  Surgeon: Nelva Bush, MD;  Location: [facility] ORS;  Service: Cardiovascular;  Laterality: N/A;  . TONSILLECTOMY      Current Meds  Medication Sig  .  apixaban (ELIQUIS) 5 MG TABS tablet Take 1 tablet (5 mg total) by mouth 2 (two) times daily.  . metoprolol tartrate (LOPRESSOR) 50 MG tablet Take 1 tablet (50 mg total) by mouth 2 (two) times daily.    Allergies:   Sulfa antibiotics   Social History:  The patient  reports that he has never smoked. He has never used smokeless tobacco. He reports that he drinks alcohol. He reports that he does not use drugs.   Family History:  The patient's family history includes Heart disease in his father and mother; Hypertension in his father and mother.  ROS:   Review of Systems  Constitutional: Positive for malaise/fatigue. Negative for chills, diaphoresis, fever and weight loss.  HENT: Negative for congestion.   Eyes: Negative for discharge and redness.  Respiratory: Positive for cough. Negative for hemoptysis, sputum production, shortness of breath and wheezing.        Much improved cough  Cardiovascular: Negative for chest pain, palpitations, orthopnea, claudication, leg swelling and PND.  Gastrointestinal: Negative for abdominal pain, blood in stool, heartburn, melena, nausea and vomiting.  Genitourinary: Negative for hematuria.  Musculoskeletal: Negative for falls and myalgias.  Skin: Negative for rash.  Neurological: Positive for dizziness. Negative for tingling, tremors, sensory change, speech change, focal weakness, loss of consciousness and weakness.  Endo/Heme/Allergies: Does not bruise/bleed easily.  Psychiatric/Behavioral: Negative for substance abuse. The patient is not nervous/anxious.   All other systems reviewed and are negative.    PHYSICAL EXAM:  VS:  BP 124/62 (BP Location: Left Arm, Patient Position: Sitting, Cuff Size: Normal)   Pulse 65   Ht 6\' 1"  (1.854 m)   Wt 231 lb 12 oz (105.1 kg)   BMI 30.58 kg/m  BMI: Body mass index is 30.58 kg/m.  Physical Exam  Constitutional: He is oriented to person, place, and time. He appears well-developed and well-nourished.  HENT:    Head: Normocephalic and atraumatic.  Eyes: Right eye exhibits no discharge. Left eye exhibits no discharge.  Neck: Normal range of motion. No JVD present.  Cardiovascular: Normal rate, regular rhythm, S1 normal and S2 normal. Exam reveals no distant heart sounds, no friction rub, no midsystolic click and no opening snap.  Murmur heard.  Harsh midsystolic murmur is present with a grade of 2/6 at the upper right sternal border radiating to the neck. Pulses:      Posterior tibial pulses are 2+ on the right side, and 2+ on the left side.  Pulmonary/Chest: Effort normal and breath sounds normal. No respiratory distress. He has no decreased breath sounds. He has no wheezes. He has no rales. He exhibits no tenderness.  Abdominal: Soft. He exhibits no distension. There is no tenderness.  Musculoskeletal: He exhibits no edema.  Neurological: He is alert and oriented to person, place, and time.  Skin: Skin is warm and dry. No cyanosis. Nails show no clubbing.  Psychiatric: He has a normal mood and affect. His speech is normal and behavior is normal. Judgment and thought content normal.     EKG:  Was ordered and  interpreted by me today. Shows NSR, 65 bpm, incomplete RBBB, left anterior fascicular block, no acute st/t changes  Recent Labs: 02/25/2018: B Natriuretic Peptide 229.0; Magnesium 2.1; TSH 2.573 02/27/2018: BUN 21; Creatinine, Ser 0.68; Hemoglobin 14.7; Platelets 289; Potassium 4.1; Sodium 140  No results found for requested labs within last 8760 hours.   Estimated Creatinine Clearance: 109.4 mL/min (by C-G formula based on SCr of 0.68 mg/dL).   Wt Readings from Last 3 Encounters:  03/14/18 231 lb 12 oz (105.1 kg)  03/11/18 227 lb 11.2 oz (103.3 kg)  03/01/18 222 lb (100.7 kg)     Other studies reviewed: Additional studies/records reviewed today include: summarized above  ASSESSMENT AND PLAN:  1. PAF: Maintaining sinus rhythm with well controlled heart rate. He feels like metoprolol  has lead to some fatigue. Change from metoprolol 50 mg bid to Coreg 6.25 mg bid with plans to increase to 12.5 mg bid by the end of the week if BP/HR allow (he will call with readings from home). Continue Eliquis 5 mg bid given his CHADS2VASc of at least 2 (CHF, age x 1). Schedule sleep study as below. Plan for Lexiscan Myoview to evaluate for high risk ischemia (no treadmill given Afib).  2. HFrEF: He does not appear grossly volume up. His weight is up to 231 pounds today, though he feels like this is secondary to increaed calories. There is no JVD on exam, lungs are clear to auscultation bilaterally, no lower extremity edema, or abdominal swelling. He reports really enjoying a pizza place in [place]. Likely in the setting of his Afib. Plan to repeat limited echo in ~ 3 months to evaluate for improvement in EF. Check Myoview as above. Change Lopressor to Coreg as above. BP typically runs in the low 100s, with the transition to Coreg as above I will not yet add on ACEi/ARB. If we see that his BP would tolerate this when he calls with readings we can consider that at that time. CHF education.   3. Bicuspid aortic valve: Asymptomatic. Periodic echo. Obtaining a repeat echo in ~ 3 months to assess his EF as above, will plan for echo to evaluate his aortica valve and aorta in ~ 12 months.   4. Dilated aortic root: Will plan for periodic echocardiograms as an outpatient. Optimal BP/HR and lipid control advised.   5. Snoring: Referral to pulmonology for sleep study.   Disposition: F/u with me in 3 months.   Current medicines are reviewed at length with the patient today.  The patient did not have any concerns regarding medicines.  Signed, Christell Faith, PA-C 03/14/2018 2:10 PM     [address], [place] [address] [phone]

## 2018-03-11 NOTE — Patient Instructions (Signed)
Please get a follow up NON CONTRAST CT SCAN in 3-6 months for pulmonary nodules at the [facility]

## 2018-03-14 ENCOUNTER — Encounter: Payer: Self-pay | Admitting: Physician Assistant

## 2018-03-14 ENCOUNTER — Ambulatory Visit (INDEPENDENT_AMBULATORY_CARE_PROVIDER_SITE_OTHER): Payer: Medicare HMO | Admitting: Physician Assistant

## 2018-03-14 VITALS — BP 124/62 | HR 65 | Ht 73.0 in | Wt 231.8 lb

## 2018-03-14 DIAGNOSIS — Q231 Congenital insufficiency of aortic valve: Secondary | ICD-10-CM | POA: Diagnosis not present

## 2018-03-14 DIAGNOSIS — R0683 Snoring: Secondary | ICD-10-CM

## 2018-03-14 DIAGNOSIS — I5022 Chronic systolic (congestive) heart failure: Secondary | ICD-10-CM | POA: Diagnosis not present

## 2018-03-14 DIAGNOSIS — I77819 Aortic ectasia, unspecified site: Secondary | ICD-10-CM

## 2018-03-14 DIAGNOSIS — I480 Paroxysmal atrial fibrillation: Secondary | ICD-10-CM

## 2018-03-14 MED ORDER — CARVEDILOL 6.25 MG PO TABS: 6.2500 mg | ORAL_TABLET | Freq: Two times a day (BID) | ORAL | 3 refills | Status: DC

## 2018-03-14 NOTE — Patient Instructions (Addendum)
Medication Instructions:  Your physician has recommended you make the following change in your medication:  1- STOP Metoprolol. 2- START Carvedilol 6.25 mg by mouth two times a day.   Labwork: Your physician recommends that you return for lab work in: TODAY (BMET, CBC)   Testing/Procedures: Your physician has requested that you have an echocardiogram. IN 3 MONTHS. Echocardiography is a painless test that uses sound waves to create images of your heart. It provides your doctor with information about the size and shape of your heart and how well your heart's chambers and valves are working. This procedure takes approximately one hour. There are no restrictions for this procedure. You may get an IV, if needed, to receive an ultrasound enhancing agent through to better visualize your heart.    [place]  Your caregiver has ordered a Stress Test with nuclear imaging. The purpose of this test is to evaluate the blood supply to your heart muscle. This procedure is referred to as a "Non-Invasive Stress Test." This is because other than having an IV started in your vein, nothing is inserted or "invades" your body. Cardiac stress tests are done to find areas of poor blood flow to the heart by determining the extent of coronary artery disease (CAD). Some patients exercise on a treadmill, which naturally increases the blood flow to your heart, while others who are  unable to walk on a treadmill due to physical limitations have a pharmacologic/chemical stress agent called Lexiscan . This medicine will mimic walking on a treadmill by temporarily increasing your coronary blood flow.   Please note: these test may take anywhere between 2-4 hours to complete  PLEASE REPORT TO [facility] AT THE FIRST DESK WILL DIRECT YOU WHERE TO GO  Date of Procedure:_____________________________________  Arrival Time for Procedure:______________________________   PLEASE NOTIFY THE  OFFICE AT LEAST 24 HOURS IN ADVANCE IF YOU ARE UNABLE TO KEEP YOUR APPOINTMENT.  [phone] AND  PLEASE NOTIFY NUCLEAR MEDICINE AT [facility] AT LEAST 24 HOURS IN ADVANCE IF YOU ARE UNABLE TO KEEP YOUR APPOINTMENT. [phone]  How to prepare for your Myoview test:  1. Do not eat or drink after midnight 2. No caffeine for 24 hours prior to test 3. No smoking 24 hours prior to test. 4. Your medication may be taken with water.  If your doctor stopped a medication because of this test, do not take that medication. 5. Ladies, please do not wear dresses.  Skirts or pants are appropriate. Please wear a short sleeve shirt. 6. No perfume, cologne or lotion. 7. Wear comfortable walking shoes. No heels!        Follow-Up: You have been referred to PULMONOLOGY FOR SLEEP STUDY.    Your physician recommends that you schedule a follow-up appointment in: 3 MONTHS WITH DR ARIDA AFTER ECHO.   If you need a refill on your cardiac medications before your next appointment, please call your pharmacy.    Cardiac Nuclear Scan A cardiac nuclear scan is a test that measures blood flow to the heart when a person is resting and when he or she is exercising. The test looks for problems such as:  Not enough blood reaching a portion of the heart.  The heart muscle not working normally.  You may need this test if:  You have heart disease.  You have had abnormal lab results.  You have had heart surgery or angioplasty.  You have chest pain.  You have shortness of breath.  In this test, a radioactive dye (tracer) is injected into your bloodstream. After the tracer has traveled to your heart, an imaging device is used to measure how much of the tracer is absorbed by or distributed to various areas of your heart. This procedure is usually done at a hospital and takes 2-4 hours. Tell a health care provider about:  Any allergies you have.  All medicines you are taking, including vitamins, herbs, eye  drops, creams, and over-the-counter medicines.  Any problems you or family members have had with the use of anesthetic medicines.  Any blood disorders you have.  Any surgeries you have had.  Any medical conditions you have.  Whether you are pregnant or may be pregnant. What are the risks? Generally, this is a safe procedure. However, problems may occur, including:  Serious chest pain and heart attack. This is only a risk if the stress portion of the test is done.  Rapid heartbeat.  Sensation of warmth in your chest. This usually passes quickly.  What happens before the procedure?  Ask your health care provider about changing or stopping your regular medicines. This is especially important if you are taking diabetes medicines or blood thinners.  Remove your jewelry on the day of the procedure. What happens during the procedure?  An IV tube will be inserted into one of your veins.  Your health care provider will inject a small amount of radioactive tracer through the tube.  You will wait for 20-40 minutes while the tracer travels through your bloodstream.  Your heart activity will be monitored with an electrocardiogram (ECG).  You will lie down on an exam table.  Images of your heart will be taken for about 15-20 minutes.  You may be asked to exercise on a treadmill or stationary bike. While you exercise, your heart's activity will be monitored with an ECG, and your blood pressure will be checked. If you are unable to exercise, you may be given a medicine to increase blood flow to parts of your heart.  When blood flow to your heart has peaked, a tracer will again be injected through the IV tube.  After 20-40 minutes, you will get back on the exam table and have more images taken of your heart.  When the procedure is over, your IV tube will be removed. The procedure may vary among health care providers and hospitals. Depending on the type of tracer used, scans may need to  be repeated 3-4 hours later. What happens after the procedure?  Unless your health care provider tells you otherwise, you may return to your normal schedule, including diet, activities, and medicines.  Unless your health care provider tells you otherwise, you may increase your fluid intake. This will help flush the contrast dye from your body. Drink enough fluid to keep your urine clear or pale yellow.  It is up to you to get your test results. Ask your health care provider, or the department that is doing the test, when your results will be ready. Summary  A cardiac nuclear scan measures the blood flow to the heart when a person is resting and when he or she is exercising.  You may need this test if you are at risk for heart disease.  Tell your health care provider if you are pregnant.  Unless your health care provider tells you otherwise, increase your fluid intake. This will help flush the contrast dye from your body. Drink enough fluid to keep your urine clear or pale  yellow. This information is not intended to replace advice given to you by your health care provider. Make sure you discuss any questions you have with your health care provider. Document Released: 09/11/2004 Document Revised: 08/19/2016 Document Reviewed: 07/26/2013 Elsevier Interactive Patient Education  2017 [place].    Echocardiogram An echocardiogram, or echocardiography, uses sound waves (ultrasound) to produce an image of your heart. The echocardiogram is simple, painless, obtained within a short period of time, and offers valuable information to your health care provider. The images from an echocardiogram can provide information such as:  Evidence of coronary artery disease (CAD).  Heart size.  Heart muscle function.  Heart valve function.  Aneurysm detection.  Evidence of a past heart attack.  Fluid buildup around the heart.  Heart muscle thickening.  Assess heart valve function.  Tell a  health care provider about:  Any allergies you have.  All medicines you are taking, including vitamins, herbs, eye drops, creams, and over-the-counter medicines.  Any problems you or family members have had with anesthetic medicines.  Any blood disorders you have.  Any surgeries you have had.  Any medical conditions you have.  Whether you are pregnant or may be pregnant. What happens before the procedure? No special preparation is needed. Eat and drink normally. What happens during the procedure?  In order to produce an image of your heart, gel will be applied to your chest and a wand-like tool (transducer) will be moved over your chest. The gel will help transmit the sound waves from the transducer. The sound waves will harmlessly bounce off your heart to allow the heart images to be captured in real-time motion. These images will then be recorded.  You may need an IV to receive a medicine that improves the quality of the pictures. What happens after the procedure? You may return to your normal schedule including diet, activities, and medicines, unless your health care provider tells you otherwise. This information is not intended to replace advice given to you by your health care provider. Make sure you discuss any questions you have with your health care provider. Document Released: 08/14/2000 Document Revised: 04/04/2016 Document Reviewed: 04/24/2013 Elsevier Interactive Patient Education  2017 [employer].

## 2018-03-16 ENCOUNTER — Other Ambulatory Visit
Admission: RE | Admit: 2018-03-16 | Discharge: 2018-03-16 | Disposition: A | Discharge status: Home / Self Care | Payer: Medicare HMO | Source: Ambulatory Visit | Attending: Physician Assistant | Admitting: Physician Assistant

## 2018-03-16 DIAGNOSIS — I77819 Aortic ectasia, unspecified site: Secondary | ICD-10-CM | POA: Insufficient documentation

## 2018-03-16 DIAGNOSIS — I480 Paroxysmal atrial fibrillation: Secondary | ICD-10-CM | POA: Diagnosis not present

## 2018-03-16 DIAGNOSIS — I5022 Chronic systolic (congestive) heart failure: Secondary | ICD-10-CM | POA: Insufficient documentation

## 2018-03-16 LAB — CBC WITH DIFFERENTIAL/PLATELET
BASOS ABS: 0 10*3/uL (ref 0–0.1)
BASOS PCT: 1 %
Eosinophils Absolute: 0.1 10*3/uL (ref 0–0.7)
Eosinophils Relative: 2 %
HCT: 42.2 % (ref 40.0–52.0)
HEMOGLOBIN: 14.7 g/dL (ref 13.0–18.0)
Lymphocytes Relative: 19 %
Lymphs Abs: 1.3 10*3/uL (ref 1.0–3.6)
MCH: 30.9 pg (ref 26.0–34.0)
MCHC: 34.9 g/dL (ref 32.0–36.0)
MCV: 88.5 fL (ref 80.0–100.0)
Monocytes Absolute: 0.6 10*3/uL (ref 0.2–1.0)
Monocytes Relative: 9 %
NEUTROS PCT: 69 %
Neutro Abs: 5 10*3/uL (ref 1.4–6.5)
Platelets: 243 10*3/uL (ref 150–440)
RBC: 4.76 MIL/uL (ref 4.40–5.90)
RDW: 13.6 % (ref 11.5–14.5)
WBC: 7.1 10*3/uL (ref 3.8–10.6)

## 2018-03-16 LAB — BASIC METABOLIC PANEL
Anion gap: 9 (ref 5–15)
BUN: 23 mg/dL (ref 8–23)
CHLORIDE: 107 mmol/L (ref 98–111)
CO2: 24 mmol/L (ref 22–32)
CREATININE: 1.13 mg/dL (ref 0.61–1.24)
Calcium: 8.9 mg/dL (ref 8.9–10.3)
GFR calc non Af Amer: >60 mL/min (ref >60)
Glucose, Bld: 97 mg/dL (ref 70–99)
Potassium: 4.4 mmol/L (ref 3.5–5.1)
SODIUM: 140 mmol/L (ref 135–145)

## 2018-03-24 ENCOUNTER — Other Ambulatory Visit: Payer: Medicare HMO

## 2018-03-29 ENCOUNTER — Telehealth: Payer: Self-pay

## 2018-03-29 ENCOUNTER — Telehealth: Payer: Self-pay | Admitting: Internal Medicine

## 2018-03-29 NOTE — Telephone Encounter (Signed)
*  STAT* If patient is at the pharmacy, call can be transferred to refill team.   1. Which medications need to be refilled? (please list name of each medication and dose if known) Eliquis  2. Which pharmacy/location (including street and city if local pharmacy) is medication to be sent to? Pt would like a written rx due to he will take to the [place]  3. Do they need a 30 day or 90 day supply? Not sure

## 2018-03-29 NOTE — Telephone Encounter (Signed)
Please review for refill, Thanks !  

## 2018-03-29 NOTE — Telephone Encounter (Signed)
Error

## 2018-03-30 ENCOUNTER — Telehealth: Payer: Self-pay | Admitting: Internal Medicine

## 2018-03-30 MED ORDER — APIXABAN 5 MG PO TABS: 5.0000 mg | ORAL_TABLET | Freq: Two times a day (BID) | ORAL | 1 refills | Status: AC

## 2018-03-30 NOTE — Telephone Encounter (Signed)
Patient calling  States that he will be coming by soon to pick up medication  Would like to know if he can see a nurse in order to have blood pressure checked, patient has a machine but does not think it is giving accurate readings Please call to discuss

## 2018-03-30 NOTE — Telephone Encounter (Signed)
Patient notified that Rx signed and at front desk to pick up at his earliest convenience.

## 2018-03-30 NOTE — Telephone Encounter (Signed)
Faxed to [facility] per Patient request .  Confirmed Correct Fax with [place]

## 2018-03-30 NOTE — Telephone Encounter (Signed)
Will sign when I come down from the hospital. Thanks.

## 2018-03-30 NOTE — Telephone Encounter (Signed)
Patient in office to pick up rx and reports bp 90's/70's /  C/o some dizziness .  Will wait for nurse to call back.

## 2018-03-30 NOTE — Telephone Encounter (Signed)
Pt 72 y/o, wt 105.1 kg, serum creatinine 1.13, CrCl 90.43. Ok to refill Eliquis 5 mg BID. Anderson Malta, can you provide him w/ written rx to take to the [place]? Thank you!

## 2018-03-30 NOTE — Telephone Encounter (Signed)
Prescription printed. Await signature from provider.

## 2018-03-31 MED ORDER — CARVEDILOL 3.125 MG PO TABS: 3.1250 mg | ORAL_TABLET | Freq: Two times a day (BID) | ORAL | 3 refills | Status: DC

## 2018-03-31 NOTE — Telephone Encounter (Signed)
Spoke with the patient in regards to his low BP readings.  He states he will typically only check his BP when he is having symptoms of dizziness. He has been seeing BP readings of ~117/80 before his meds. He will wait about 30 minutes after he takes his coreg then check a BP.  When he is dizzy, his BP is running 90's/70's.  HR's will be in the 60's. He is becoming more concerned about the dizziness he is experiencing- not with every dose of coreg, but more frequent than not.   He confirms he is taking coreg 6.25 mg BID (started ~ 03/14/18 per Thurmond Butts, [place]). He states that when the dizziness occurs, it can last from a few minutes up to 3 hours.  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 He has not had syncope/ pre-syncope. He is staying hydrated. He does notice that his HR will increase to around 120 bpm with exercise, but it will not stay there for very long.  He is able to tolerate exercise for the most part, but knows when he needs to rest for a minute.   PM readings for his BP have been ~ 117/88 prior to his meds.   I advised him I will review with Thurmond Butts, PA to see if his meds may need to be adjusted. I advised the patient that we could maybe try coreg 3.125 mg in the AM & coreg 6.25 mg in the PM to see how he tolerates this, but will review with the PA and call him back with further recommendations.  The patient voices understanding and is agreeable.

## 2018-03-31 NOTE — Telephone Encounter (Signed)
Spoke with patient about medication change and he accepted. Advised if symptoms persist or worsen to call back.

## 2018-03-31 NOTE — Telephone Encounter (Signed)
Please decrease Coreg to 3.125 mg bid. If he continues to note soft BP/dizziness on low dose Coreg we may need to change to metoprolol.

## 2018-04-06 DIAGNOSIS — R079 Chest pain, unspecified: Secondary | ICD-10-CM | POA: Diagnosis not present

## 2018-04-07 ENCOUNTER — Telehealth: Payer: Self-pay | Admitting: Internal Medicine

## 2018-04-07 NOTE — Telephone Encounter (Signed)
Patient transferring to [place] - came by to cancel appointments Patient would like to speak with nurse in regards to stress test that was recommended - would like to know reasoning behind that recommendation Please call to discuss

## 2018-04-08 NOTE — Telephone Encounter (Signed)
Left a message to call back.

## 2018-04-12 NOTE — Telephone Encounter (Signed)
I spoke with the patient and discussed the reasoning behind the myoview being ordered.  He states he is transferring his care to the [place]. He has an appointment with the Cardiology dept there and will need to take his records with him.  I have pulled his cardiology hospital records/ echo/ lab/ ekg's/ Ryan's last office note for him to come sign a release for and pick up. The patient is aware this will be at the front desk for pick up.  He is agreeable.

## 2018-04-13 ENCOUNTER — Institutional Professional Consult (permissible substitution): Payer: Medicare HMO | Admitting: Internal Medicine

## 2018-04-27 DIAGNOSIS — R69 Illness, unspecified: Secondary | ICD-10-CM | POA: Diagnosis not present

## 2018-05-03 ENCOUNTER — Other Ambulatory Visit: Payer: Medicare HMO

## 2018-06-20 ENCOUNTER — Other Ambulatory Visit: Payer: Medicare HMO

## 2018-11-02 ENCOUNTER — Ambulatory Visit: Payer: Medicare HMO

## 2018-11-04 DIAGNOSIS — R69 Illness, unspecified: Secondary | ICD-10-CM | POA: Diagnosis not present

## 2019-04-19 ENCOUNTER — Ambulatory Visit: Payer: Medicare HMO

## 2019-06-26 DIAGNOSIS — R69 Illness, unspecified: Secondary | ICD-10-CM | POA: Diagnosis not present

## 2019-10-25 DIAGNOSIS — H524 Presbyopia: Secondary | ICD-10-CM | POA: Diagnosis not present

## 2019-11-01 DIAGNOSIS — I10 Essential (primary) hypertension: Secondary | ICD-10-CM | POA: Diagnosis not present

## 2019-11-01 DIAGNOSIS — E669 Obesity, unspecified: Secondary | ICD-10-CM | POA: Diagnosis not present

## 2019-11-01 DIAGNOSIS — Z008 Encounter for other general examination: Secondary | ICD-10-CM | POA: Diagnosis not present

## 2019-11-01 DIAGNOSIS — G8929 Other chronic pain: Secondary | ICD-10-CM | POA: Diagnosis not present

## 2019-11-01 DIAGNOSIS — I4891 Unspecified atrial fibrillation: Secondary | ICD-10-CM | POA: Diagnosis not present

## 2019-11-01 DIAGNOSIS — Z6834 Body mass index (BMI) 34.0-34.9, adult: Secondary | ICD-10-CM | POA: Diagnosis not present

## 2019-11-01 DIAGNOSIS — Z8249 Family history of ischemic heart disease and other diseases of the circulatory system: Secondary | ICD-10-CM | POA: Diagnosis not present

## 2019-11-01 DIAGNOSIS — Z7901 Long term (current) use of anticoagulants: Secondary | ICD-10-CM | POA: Diagnosis not present

## 2019-11-01 DIAGNOSIS — R69 Illness, unspecified: Secondary | ICD-10-CM | POA: Diagnosis not present

## 2019-11-01 DIAGNOSIS — M1990 Unspecified osteoarthritis, unspecified site: Secondary | ICD-10-CM | POA: Diagnosis not present

## 2019-11-01 DIAGNOSIS — R32 Unspecified urinary incontinence: Secondary | ICD-10-CM | POA: Diagnosis not present

## 2019-12-26 DIAGNOSIS — R69 Illness, unspecified: Secondary | ICD-10-CM | POA: Diagnosis not present

## 2020-02-08 NOTE — Progress Notes (Signed)
Letter error. No letter sent

## 2020-02-18 IMAGING — CT CT ANGIO CHEST
2 of 6 series · 16 of 46 positions shown · IV contrast (APPLIED)
Comparison: None.

CHIEF COMPLAINT:
Contrast extravasation into left arm.

HISTORY OF PRESENT ILLNESS:
71-year-old male presented to the [HOSPITAL] for a CT of
the chest with intravenous contrast. Functioning IV was present in
the right forearm.
Contrast was injected through the IV with subsequently failed with
extravasation of 55 mL of Iminpaque-EHT contrast into the right
upper arm.
ALLERGIES:
No allergies to iodinated
REVIEW OF SYSTEMS:
Limited review of systems.
Constitutional: No acute distress
Skin: No rashes. No pain. No blistering. Mild bruising at the
injection site.
Musculoskeletal: No changes in strength.  No pain.
Heme/lymph: Denies bruising.
Neurologic: No numbness or tingling.
PHYSICAL EXAMINATION:
Vitals:  Pulse: 89bpm
General: No acute distress.
Skin: No rashes. Skin warm and dry. No erythematous areas. No
blistering. No bruising.
Extremities: No edema. No cyanosis or clubbing. Capillary refill: 2
seconds
Musculoskeletal: [DATE] strength of bilateral hands. Normal range of
motion.
Neurologic:  Intact sensation.
ASSESSMENT AND PLAN:
Patient presents for IV contrast extravasation into the right
forearm during CT of the chest with intravenous contrast.
There is no evidence of compartment syndrome at this time. There is
no evidence of vascular compromise or neurologic deficits.
PLAN:
1.  Elevate affected extremity (above heart)
2. Utilize ice packs (15-60) minutes applications four times per
day. Note - ice to be wrapped in a sheet or towel to reduce the risk
of frost bite.
3. If either at the time of extravasation or at check at 5 and 10
minutes, there is a concern of intense pain and decreased sensation,
call plastic surgery (general surgery is plastic surgery not
available) for an emergent consult.
4. Immediate surgical consult for the following: progressive
swelling or pain, altered tissue perfusion as evident by decreased
capillary refill at any time after the extravasation has occurred,
change in sensation in the affected limb, and skin ulceration or
blistering. It is important to note that initial symptoms of a
compartment syndrome may be relatively mild (such as limited to the
development of focal paresthesia).
5. If no appreciable injury, damage or abnormal patient sensation,
then the patient can be discharged, at one hour at the earliest. If
there is any uncertainty, then patient should be kept and monitored
for 2-4 hours.
6. When discharging, patient instructions must include to return to
the emergency department if arm pain, skin blistering or significant
abnormal sensation occurs in the injection site and areas distal to
it.
CLINICAL DATA: Recent long travel. New onset atrial fibrillation.
Prior knee replacement. Rule out PE.
EXAM:
CT ANGIOGRAPHY CHEST WITH CONTRAST
TECHNIQUE: Multidetector CT imaging of the chest was performed using the
standard protocol during bolus administration of intravenous
contrast. Multiplanar CT image reconstructions and MIPs were
obtained to evaluate the vascular anatomy.
CONTRAST:  75mL OMNIPAQUE IOHEXOL 350 MG/ML SOLN, 75mL OMNIPAQUE
IOHEXOL 350 MG/ML SOLN

[Series 7: thins · axial · 0.76mm/px · z∈[-441,-118]mm · 14 of 355 slices shown]
[im 16/355  lung]
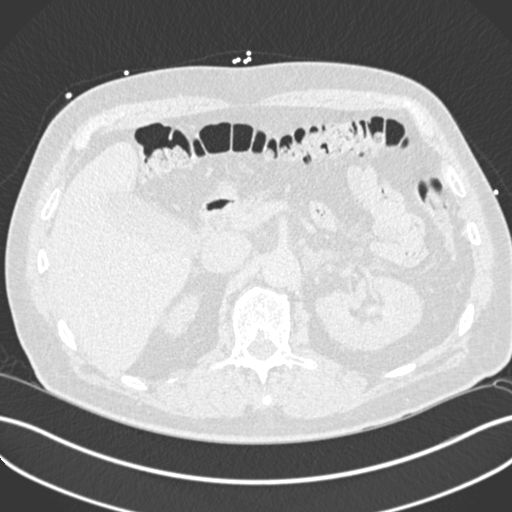
[im 47/355  soft-tissue]
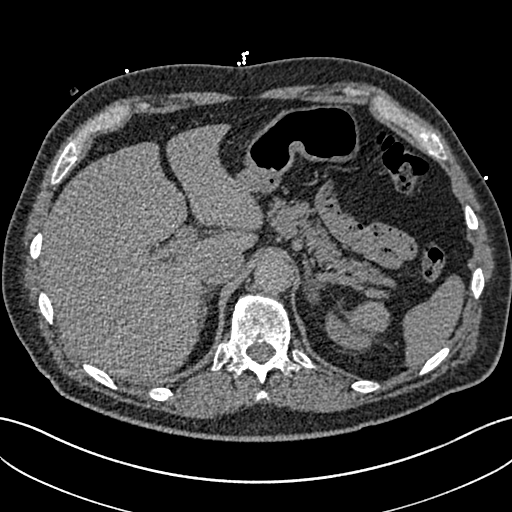
[im 62/355  lung]
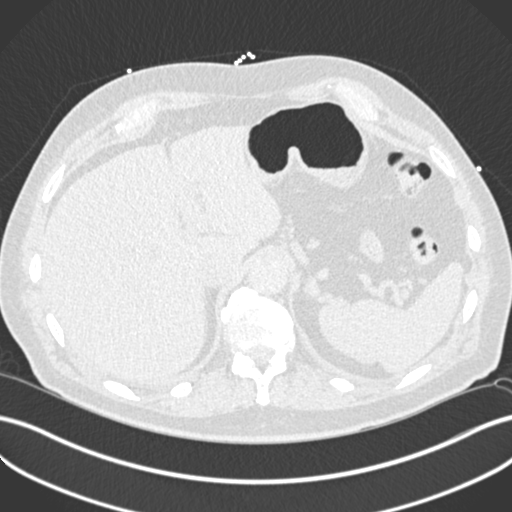
[im 93/355  soft-tissue]
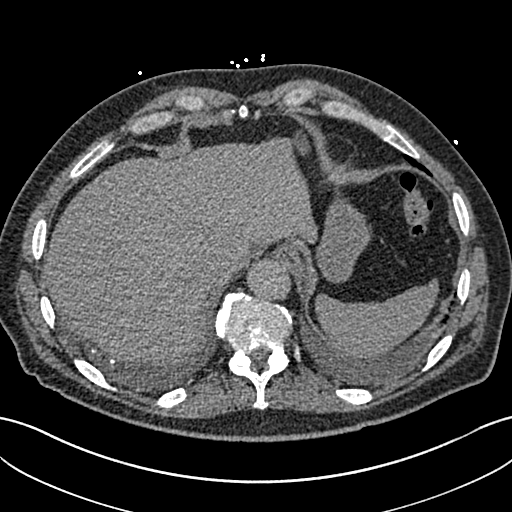
[im 124/355  lung]
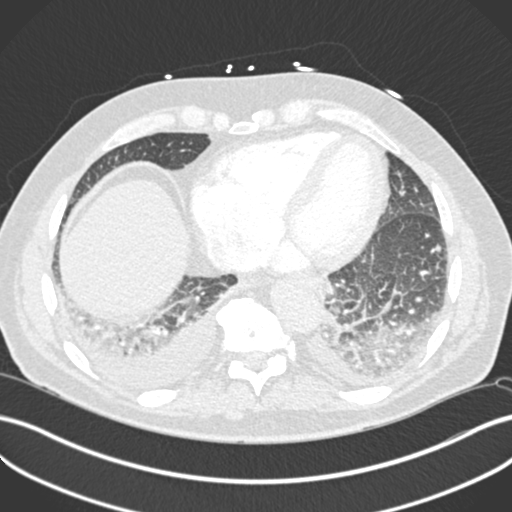
[im 139/355  soft-tissue]
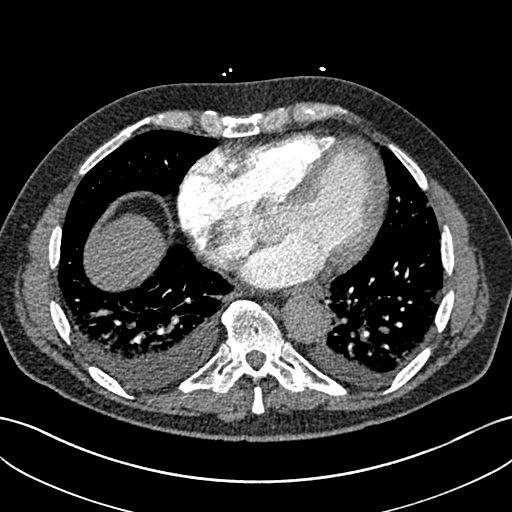
[im 170/355  lung]
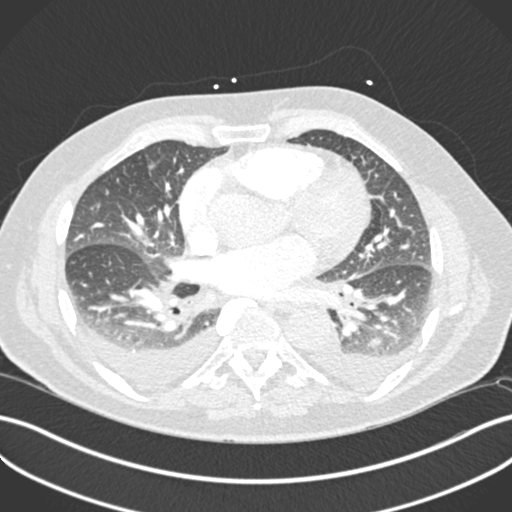
[im 185/355  soft-tissue]
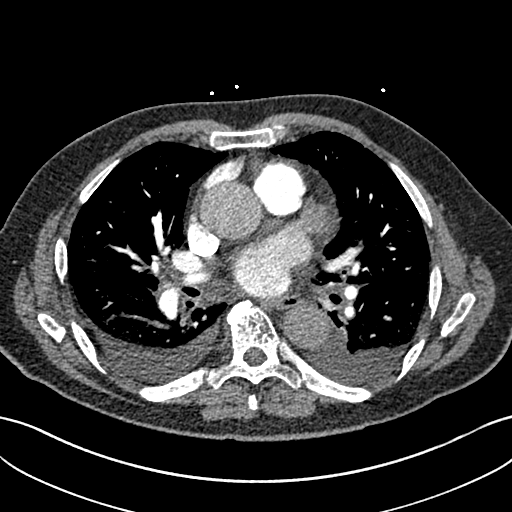
[im 216/355  lung]
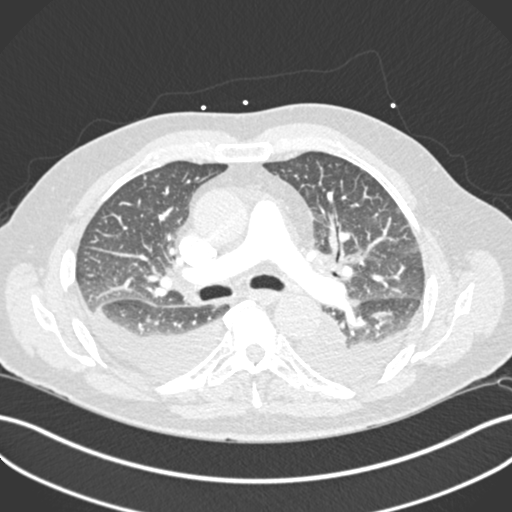
[im 231/355  soft-tissue]
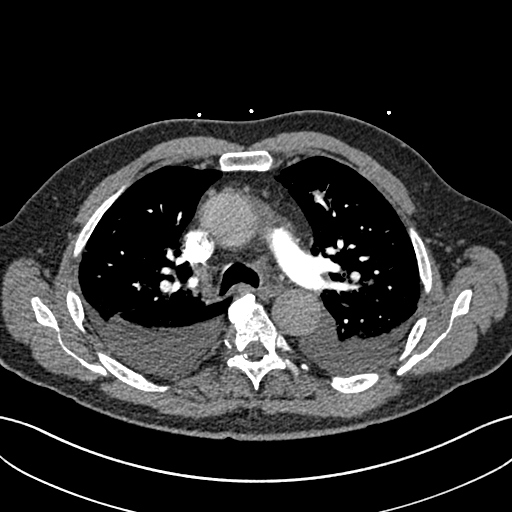
[im 262/355  lung]
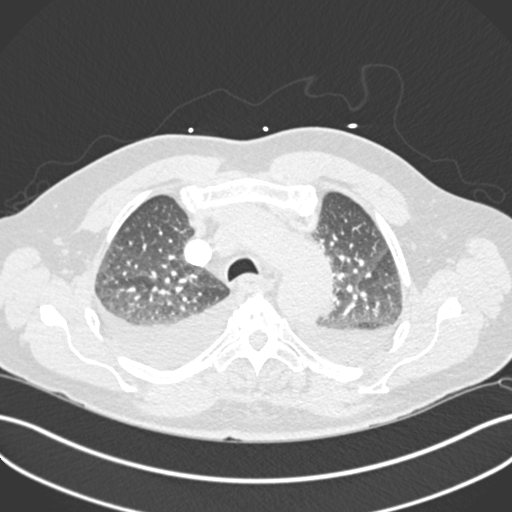
[im 293/355  soft-tissue]
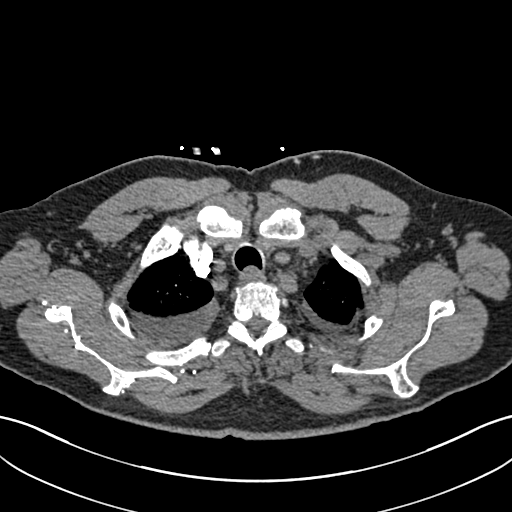
[im 308/355  lung]
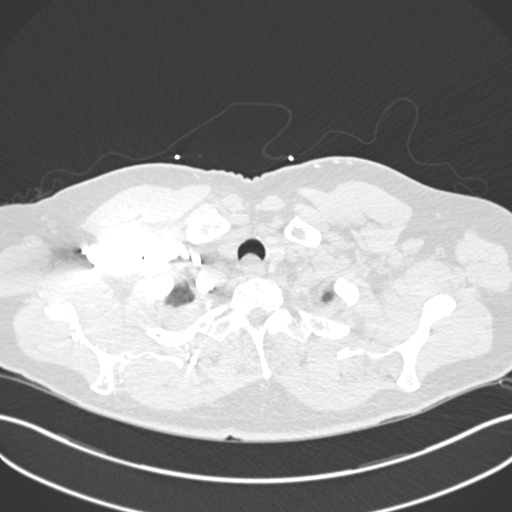
[im 339/355  soft-tissue]
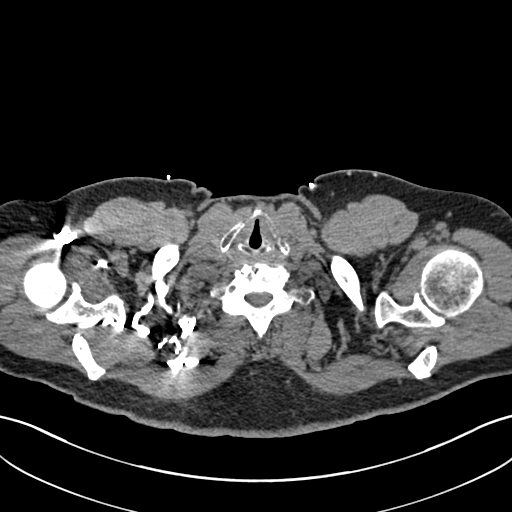

[Series 9: coronal mpr · coronal · 0.70mm/px · 2 of 93 slices shown]
[im 31/93  soft-tissue]
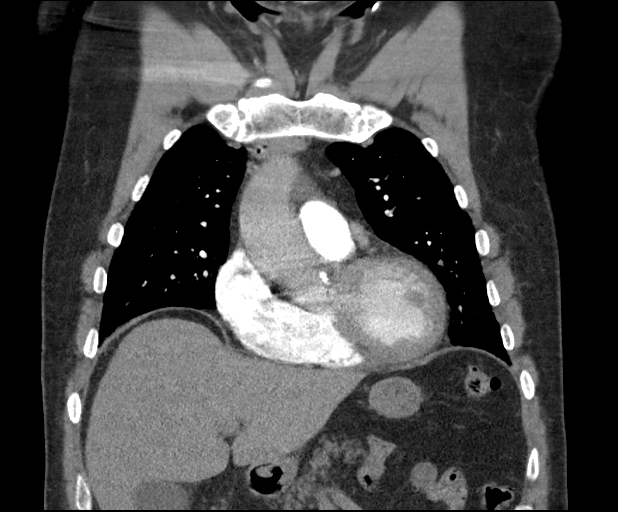
[im 62/93  soft-tissue]
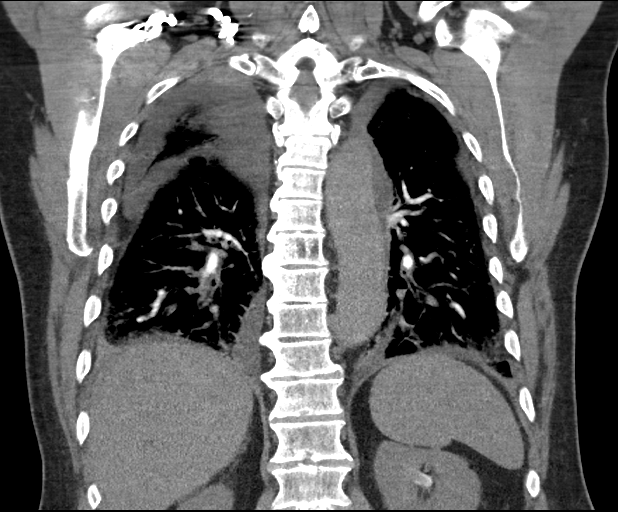

[16 of 46 positions shown; findings below may reference images not displayed]

FINDINGS: Cardiovascular: Heart size is normal. No pericardial effusion. There
are focal coronary artery calcifications. The pulmonary arteries are
well opacified and there is no acute pulmonary embolus. The thoracic
aorta is tortuous but otherwise normal in appearance.

Mediastinum/Nodes: The visualized portion of the thyroid gland has a
normal appearance. Esophagus is normal in appearance. No
mediastinal, hilar, or axillary adenopathy.

Lungs/Pleura: There are moderate bilateral pleural effusions. Within
the LEFT UPPER lobe there is a 2.2 x 1.3 centimeter spiculated
density. Multiple circumscribed pulmonary nodules are identified
throughout the lungs, largest identified within the LEFT LOWER lobe,
measuring 9 millimeters in diameter. There is diffuse smooth septal
wall thickening and patchy areas of airspace filling, consistent
with pulmonary edema.

Upper Abdomen: There is probable contrast within the collecting
system of the LEFT kidney. The gallbladder is present.

Musculoskeletal: Significant degenerative changes in the midthoracic
spine.

Review of the MIP images confirms the above findings.
IMPRESSION: 1. Technically adequate exam showing no acute pulmonary embolus.
2. There are bilateral pleural effusions and parenchymal changes
consistent with pulmonary edema.
3. Numerous pulmonary nodules throughout the lungs. Although most of
these are smooth and circumscribed, largest measuring 9 millimeters,
an irregular mass in the LEFT UPPER lobe is 2.2 x 1.3 centimeters.
Considerations postinflammatory process and malignancy. Non-contrast
chest CT at 3-6 months is recommended. If nodules persist,
subsequent management will be based upon the most suspicious
nodule(s). This recommendation follows the consensus statement:
Guidelines for Management of Incidental Pulmonary Nodules Detected
4. Coronary artery calcifications. No cardiomegaly or pericardial
effusion.

## 2020-04-25 DIAGNOSIS — R69 Illness, unspecified: Secondary | ICD-10-CM | POA: Diagnosis not present

## 2020-05-30 DIAGNOSIS — R69 Illness, unspecified: Secondary | ICD-10-CM | POA: Diagnosis not present

## 2021-01-06 ENCOUNTER — Encounter: Payer: Self-pay | Admitting: *Deleted

## 2021-01-06 NOTE — Telephone Encounter (Signed)
This encounter was created in error - please disregard.

## 2021-02-19 ENCOUNTER — Ambulatory Visit (INDEPENDENT_AMBULATORY_CARE_PROVIDER_SITE_OTHER): Payer: Medicare HMO

## 2021-02-19 ENCOUNTER — Encounter: Payer: Self-pay | Admitting: Podiatry

## 2021-02-19 ENCOUNTER — Other Ambulatory Visit: Payer: Self-pay

## 2021-02-19 ENCOUNTER — Ambulatory Visit: Payer: Self-pay | Admitting: Podiatry

## 2021-02-19 ENCOUNTER — Ambulatory Visit: Payer: Medicare HMO | Admitting: Podiatry

## 2021-02-19 DIAGNOSIS — S90851A Superficial foreign body, right foot, initial encounter: Secondary | ICD-10-CM | POA: Insufficient documentation

## 2021-02-19 DIAGNOSIS — R262 Difficulty in walking, not elsewhere classified: Secondary | ICD-10-CM | POA: Insufficient documentation

## 2021-02-19 DIAGNOSIS — I790 Aneurysm of aorta in diseases classified elsewhere: Secondary | ICD-10-CM | POA: Insufficient documentation

## 2021-02-19 DIAGNOSIS — H25813 Combined forms of age-related cataract, bilateral: Secondary | ICD-10-CM | POA: Insufficient documentation

## 2021-02-19 DIAGNOSIS — M545 Low back pain, unspecified: Secondary | ICD-10-CM | POA: Insufficient documentation

## 2021-02-19 DIAGNOSIS — L603 Nail dystrophy: Secondary | ICD-10-CM

## 2021-02-19 DIAGNOSIS — H6121 Impacted cerumen, right ear: Secondary | ICD-10-CM | POA: Insufficient documentation

## 2021-02-19 DIAGNOSIS — I10 Essential (primary) hypertension: Secondary | ICD-10-CM | POA: Insufficient documentation

## 2021-02-19 DIAGNOSIS — Z461 Encounter for fitting and adjustment of hearing aid: Secondary | ICD-10-CM | POA: Insufficient documentation

## 2021-02-19 DIAGNOSIS — Z961 Presence of intraocular lens: Secondary | ICD-10-CM | POA: Insufficient documentation

## 2021-02-19 DIAGNOSIS — M722 Plantar fascial fibromatosis: Secondary | ICD-10-CM | POA: Diagnosis not present

## 2021-02-19 DIAGNOSIS — N400 Enlarged prostate without lower urinary tract symptoms: Secondary | ICD-10-CM | POA: Insufficient documentation

## 2021-02-19 DIAGNOSIS — H2511 Age-related nuclear cataract, right eye: Secondary | ICD-10-CM | POA: Insufficient documentation

## 2021-02-19 DIAGNOSIS — N529 Male erectile dysfunction, unspecified: Secondary | ICD-10-CM | POA: Insufficient documentation

## 2021-02-19 DIAGNOSIS — L570 Actinic keratosis: Secondary | ICD-10-CM | POA: Insufficient documentation

## 2021-02-19 DIAGNOSIS — I781 Nevus, non-neoplastic: Secondary | ICD-10-CM | POA: Insufficient documentation

## 2021-02-19 DIAGNOSIS — M179 Osteoarthritis of knee, unspecified: Secondary | ICD-10-CM | POA: Insufficient documentation

## 2021-02-19 DIAGNOSIS — H269 Unspecified cataract: Secondary | ICD-10-CM | POA: Insufficient documentation

## 2021-02-19 DIAGNOSIS — I119 Hypertensive heart disease without heart failure: Secondary | ICD-10-CM | POA: Insufficient documentation

## 2021-02-19 DIAGNOSIS — C44519 Basal cell carcinoma of skin of other part of trunk: Secondary | ICD-10-CM | POA: Insufficient documentation

## 2021-02-19 DIAGNOSIS — Z23 Encounter for immunization: Secondary | ICD-10-CM | POA: Insufficient documentation

## 2021-02-19 DIAGNOSIS — D485 Neoplasm of uncertain behavior of skin: Secondary | ICD-10-CM | POA: Insufficient documentation

## 2021-02-19 DIAGNOSIS — M25512 Pain in left shoulder: Secondary | ICD-10-CM | POA: Insufficient documentation

## 2021-02-19 DIAGNOSIS — M1710 Unilateral primary osteoarthritis, unspecified knee: Secondary | ICD-10-CM | POA: Insufficient documentation

## 2021-02-19 MED ORDER — TRIAMCINOLONE ACETONIDE 40 MG/ML IJ SUSP
20.0000 mg | Freq: Once | INTRAMUSCULAR | Status: AC
  Administered 2021-02-19: 20 mg

## 2021-02-19 MED ORDER — METHYLPREDNISOLONE 4 MG PO TBPK: ORAL_TABLET | ORAL | 0 refills | Status: DC

## 2021-02-19 NOTE — Progress Notes (Signed)
Subjective:  Patient ID: Christopher Vargas, male    DOB: 11/11/1945,  MRN: [id number] HPI Chief Complaint  Patient presents with   Foot Pain    Plantar heel right - aching x 1 year, started after driving cross country, worsening, AM pain, cramping in calf, tried rest and naproxen   New Patient (Initial Visit)    75 y.o. male presents with the above complaint.   ROS: Denies fever chills nausea vomiting muscle aches pains calf pain back pain chest pain shortness of breath.  Past Medical History:  Diagnosis Date   Basal cell carcinoma    Bicuspid aortic valve    a. TEE 03/01/2018: EF 45-50%, no evidence of thrombus, bicuspid aortic valve with mildly clacified leaflets, mildly dilated aortic root (44 mm) and ascending aorta (38 mm)    Chronic combined systolic and diastolic CHF (congestive heart failure) ([place])    a. Echo 6/19: EF 45-50%, diffuse HK, mild AS with a mean gradient of 6 mmHg, mild MR, mildly dilated LA   Neuropathy    Obesity    Osteoarthritis    PAF (paroxysmal atrial fibrillation) ([place])    a. diagnosed 02/25/18; b. status post TEE/DCCV 03/01/18; c. CHADS2VASc => 3 (CHF, age x 1, vascular disease); d. Eliquis   Past Surgical History:  Procedure Laterality Date   CARDIOVERSION N/A 03/01/2018   Procedure: CARDIOVERSION;  Surgeon: Nelva Bush, MD;  Location: [facility] ORS;  Service: Cardiovascular;  Laterality: N/A;   KNEE SURGERY Bilateral    REPLACEMENT TOTAL KNEE Right    TEE WITHOUT CARDIOVERSION N/A 03/01/2018   Procedure: TRANSESOPHAGEAL ECHOCARDIOGRAM (TEE);  Surgeon: Nelva Bush, MD;  Location: [facility] ORS;  Service: Cardiovascular;  Laterality: N/A;   TONSILLECTOMY      Current Outpatient Medications:    atorvastatin (LIPITOR) 80 MG tablet, Take by mouth., Disp: , Rfl:    dofetilide (TIKOSYN) 500 MCG capsule, Take by mouth., Disp: , Rfl:    losartan (COZAAR) 25 MG tablet, Take by mouth., Disp: , Rfl:    methylPREDNISolone (MEDROL DOSEPAK) 4 MG TBPK tablet, 6 day  dose pack - take as directed, Disp: 21 tablet, Rfl: 0   metoprolol succinate (TOPROL-XL) 100 MG 24 hr tablet, Take 0.5 tablets by mouth daily., Disp: , Rfl:    tadalafil (CIALIS) 20 MG tablet, Take by mouth., Disp: , Rfl:    tamsulosin (FLOMAX) 0.4 MG CAPS capsule, Take 1 capsule by mouth daily., Disp: , Rfl:    apixaban (ELIQUIS) 5 MG TABS tablet, Take 1 tablet (5 mg total) by mouth 2 (two) times daily., Disp: 180 tablet, Rfl: 1   carvedilol (COREG) 3.125 MG tablet, Take 1 tablet (3.125 mg total) by mouth 2 (two) times daily with a meal., Disp: 180 tablet, Rfl: 3  Allergies  Allergen Reactions   Darunavir     Other reaction(s): Urticaria   Sulfa Antibiotics    Review of Systems Objective:  There were no vitals filed for this visit.  General: Well developed, nourished, in no acute distress, alert and oriented x3   Dermatological: Skin is warm, dry and supple bilateral. Nails x 10 are well maintained; remaining integument appears unremarkable at this time. There are no open sores, no preulcerative lesions, no rash or signs of infection present.  Vascular: Dorsalis Pedis artery and Posterior Tibial artery pedal pulses are 2/4 bilateral with immedate capillary fill time. Pedal hair growth present. No varicosities and no lower extremity edema present bilateral.   Neruologic: Grossly intact via light touch bilateral. Vibratory  intact via tuning fork bilateral. Protective threshold with Semmes Wienstein monofilament intact to all pedal sites bilateral. Patellar and Achilles deep tendon reflexes 2+ bilateral. No Babinski or clonus noted bilateral.   Musculoskeletal: No gross boney pedal deformities bilateral. No pain, crepitus, or limitation noted with foot and ankle range of motion bilateral. Muscular strength 5/5 in all groups tested bilateral.  Pain on palpation medial calcaneal tubercle right heel.  No pain on medial lateral compression of calcaneus.  Gait: Unassisted, Nonantalgic.     Radiographs:  Osseously mature individual right foot.  Demonstrate soft tissue increase in density plantar fascial cannula insertion site no other osseous abnormalities identified.  Assessment & Plan:   Assessment: Planter fasciitis right, nail dystrophy bilateral hallux  Plan: Start him on a Medrol Dosepak.  Injected his right heel today 20 mg Kenalog 5 mg Marcaine point maximal tenderness.  Discussed appropriate shoe gear stretching exercise ice therapy sugar modifications and dispensed a plantar fascial brace.  Hallux nails bilaterally are thicker most likely secondary to trauma.  Provided him with instructions for stretching exercises.  Also took samples of the nails to be sent for pathologic evaluation.     Mattea Seger T. [place], [place]

## 2021-02-19 NOTE — Patient Instructions (Signed)

## 2021-03-07 ENCOUNTER — Encounter: Payer: Self-pay | Admitting: *Deleted

## 2021-03-19 ENCOUNTER — Telehealth: Payer: Self-pay

## 2021-03-19 NOTE — Telephone Encounter (Signed)
Patient called and left a message. He was trying to read the information about toenail fungus on my chart, but said the print was too small. Wants to know the treatment plan / next steps. Please call to advise

## 2021-04-09 ENCOUNTER — Encounter: Payer: Medicare HMO | Admitting: Podiatry

## 2021-04-16 ENCOUNTER — Telehealth: Payer: Self-pay | Admitting: Podiatry

## 2021-04-16 NOTE — Telephone Encounter (Signed)
Called pt 08.16.2022 talked with pt about scheduling an appt for follow up. Per pt he states he doesn't need to come in for follow up he is happy with how the treatman plan is going now. Let pt know to call us if changes his mind. [facility] aware.

## 2023-08-11 ENCOUNTER — Encounter: Payer: Self-pay | Admitting: Neurology

## 2023-08-11 ENCOUNTER — Ambulatory Visit (INDEPENDENT_AMBULATORY_CARE_PROVIDER_SITE_OTHER): Payer: No Typology Code available for payment source | Admitting: Neurology

## 2023-08-11 VITALS — BP 159/82 | HR 82 | Ht 73.0 in | Wt 252.2 lb

## 2023-08-11 DIAGNOSIS — I4891 Unspecified atrial fibrillation: Secondary | ICD-10-CM | POA: Diagnosis not present

## 2023-08-11 DIAGNOSIS — Z6833 Body mass index (BMI) 33.0-33.9, adult: Secondary | ICD-10-CM | POA: Diagnosis not present

## 2023-08-11 DIAGNOSIS — R0683 Snoring: Secondary | ICD-10-CM | POA: Insufficient documentation

## 2023-08-11 DIAGNOSIS — R351 Nocturia: Secondary | ICD-10-CM | POA: Insufficient documentation

## 2023-08-11 NOTE — Patient Instructions (Signed)
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 2) nocturia is more frequent when drinking caffeine containing fluids or those with carbonation.  This may be not all BPH related,     OSA : has atrial fibrillation ( controlled on medications) he reports sleep related headaches.  He snores, large neck. BMI is 33.Dry mouth in AM :He is trying a chin strap. Biotene mouth wash.      I will invite this patient to a HST for screening of sleep apnea.  If no apnea is seen and no hypoxia is seen, we need not to follow up.    I plan to follow up either personally or through our NP within 3-5 months.    I would like to thank Everardo Beals, MD and Konrad Felix, Np [address] [place],  [place] [address] for allowing me to meet with and to take care of this pleasant patient.

## 2023-08-11 NOTE — Progress Notes (Signed)
SLEEP MEDICINE CLINIC    Provider:  Melvyn Novas, MD  Primary Care Physician:  Everardo Beals, MD [address] [place] [address]     Referring Provider: Alma Friendly, Np [address] [place],  [place] [address]          Chief Complaint according to patient   Patient presents with:     New [facility] (Initial Visit)           HISTORY OF PRESENT ILLNESS:  Christopher Vargas is a 77 y.o. male patient who is seen upon [place] referral on 08/11/2023  for an evaluation of sleep apnea in the setting of NOCTURIA.  He has atrial fibrillation, dx in 2019.  He was on a cruise in 2019 when he developed fatigue, palpitatins and SOB.  He has been failed by 3 medications that were meant to reduce BPH.   Chief concern according to patient :  " I get up so many times at night, and noted tha carbonated drinks and caffeine all affect my frequency. His newest medication is carvedilol and Tykosin.   Christopher Vargas was seen on 08/11/23, he is veteran of the [employer], deployed to [place], [place] , [place], and one tour in [place]. 20 years of active service.  He retired in 1988. He  a left-handed male with a possible sleep disorder. No previous sleep studies.     Sleep relevant medical history: Nocturia 2-4 times , no Sleep walking, Night terrors, no PTSD, Tonsillectomy adenoid at age 31, knee replacement, hip bursitis,     Family medical /sleep history: eldest son  on CPAP with OSA, insomnia, sleep walkers. wife is on INspire. Sister on CPAP.    Social history:  Patient is working as a Marine scientist, is retired from [employer] and lives in a household with spouse.  Family status is married , with 3 sons / adult children, 5 grandchildren., 2 great-grand children. The patient currently works voluntarily  Pets : dogs , they don't sleep with the owner.  Tobacco use: no more .  ETOH use ; no more , no Caffeine intake nor energy drinks Exercise irregular, Pilates and  Yoga  3-5 times a week.  Hobbies :exercise, now an instructor since 2015.      Sleep habits are as follows: The patient's dinner time is between 5-7 PM. The patient goes to bed at 9.30-10 PM and continues to sleep for  7 hours, wakes for several bathroom breaks.   The preferred sleep position is supine, with the support of 1 pillow- flat .  No GERD.  Snoring observed by wife.  Dreams are reportedly frequent/vivid. Not unpleasant.   The patient wakes up spontaneously before his alarm. 5.30 AM is the usual rise time. He gives a 6 AM class, is up at 5.30  He reports not feeling refreshed or restored in AM, with symptoms such as dry mouth, morning headaches, and residual fatigue.  Naps are taken infrequently, lasting from 20 to 25 minutes -refreshing - longer naps cause headaches !     Abnormal aortic valve , bicuspid. Fater had this condition. EF in 2019 was 40-45%.  Review of Systems: Out of a complete 14 system review, the patient complains of only the following symptoms, and all other reviewed systems are negative.:  Fatigue, sleepiness , snoring, fragmented sleep by Nocturia    How likely are you to doze in the following situations:  0 = not likely, 1 = slight chance, 2 = moderate chance, 3 = high chance   Sitting and Reading? Watching Television? Sitting inactive in a public place (theater or meeting)? As a passenger in a car for an hour without a break? Lying down in the afternoon when circumstances permit? Sitting and talking to someone? Sitting quietly after lunch without alcohol? In a car, while stopped for a few minutes in traffic?   Total = 2/ 24 points   FSS endorsed at 14/ 63 points.  GDS = 0 Social History   Socioeconomic History   Marital status: Married    Spouse name: Not on file   Number of children: Not on file   Years of education: 16   Highest education level: Not on file/ bachelor in business.   Occupational History   Worked 25 years for [employer] after 20  years in [place].   Tobacco Use   Smoking status: Never   Smokeless tobacco: Never  Substance and Sexual Activity   Alcohol use: Yes   Drug use: No   Sexual activity: Not on file  Other Topics Concern   Not on file  Social History Narrative   Not on file   Social Determinants of Health   Financial Resource Strain: Not on file  Food Insecurity: Not on file  Transportation Needs: Not on file  Physical Activity: Not on file  Stress: Not on file  Social Connections: Not on file    Family History  Problem Relation Age of Onset   Heart disease Mother    Hypertension Mother    Hypertension Father    Heart disease Father     Past Medical History:  Diagnosis Date   Basal cell carcinoma    Bicuspid aortic valve    a. TEE 03/01/2018: EF 45-50%, no evidence of thrombus, bicuspid aortic valve with mildly clacified leaflets, mildly dilated aortic root (44 mm) and ascending aorta (38 mm)    Chronic combined systolic and diastolic CHF (congestive heart failure) ([facility])    a. Echo 6/19: EF 45-50%, diffuse HK, mild AS with a mean gradient of 6 mmHg, mild Christopher, mildly dilated LA   Neuropathy    Obesity    Osteoarthritis    PAF (paroxysmal atrial fibrillation) ([facility])    a. diagnosed 02/25/18; b. status post TEE/DCCV 03/01/18; c. CHADS2VASc => 3 (CHF, age x 1, vascular disease); d. Eliquis    Past Surgical History:  Procedure Laterality Date   CARDIOVERSION N/A 03/01/2018   Procedure: CARDIOVERSION;  Surgeon: Yvonne Kendall, MD;  Location: [facility] ORS;  Service: Cardiovascular;  Laterality: N/A;   KNEE SURGERY Bilateral    REPLACEMENT TOTAL KNEE Right    TEE WITHOUT CARDIOVERSION N/A 03/01/2018   Procedure: TRANSESOPHAGEAL ECHOCARDIOGRAM (TEE);  Surgeon: Yvonne Kendall, MD;  Location: [facility] ORS;  Service: Cardiovascular;  Laterality: N/A;   TONSILLECTOMY       Current Outpatient Medications on File Prior to Visit  Medication Sig Dispense Refill   acetaminophen (TYLENOL) 325 MG tablet Take 325  mg by mouth every 4 (four) hours as needed.     apixaban (ELIQUIS) 5 MG TABS tablet Take 1 tablet (5 mg total) by mouth 2 (two) times daily. 180 tablet 1   diclofenac Sodium (VOLTAREN) 1 % GEL Apply 2 g topically 4 (four) times daily.     dofetilide (TIKOSYN) 500 MCG capsule Take by mouth.     salicyclic acid-sulfur (SEBULEX) 2-2 % shampoo Apply topically daily as needed.  tadalafil (CIALIS) 20 MG tablet Take by mouth.     trospium (SANCTURA) 20 MG tablet Take 20 mg by mouth at bedtime.     No current facility-administered medications on file prior to visit.    Allergies  Allergen Reactions   Darunavir     Other reaction(s): Urticaria   Sulfa Antibiotics      DIAGNOSTIC DATA (LABS, IMAGING, TESTING) - I reviewed patient records, labs, notes, testing and imaging myself where available.  Lab Results  Component Value Date   WBC 7.1 03/16/2018   HGB 14.7 03/16/2018   HCT 42.2 03/16/2018   MCV 88.5 03/16/2018   PLT 243 03/16/2018      Component Value Date/Time   NA 140 03/16/2018 1114   K 4.4 03/16/2018 1114   CL 107 03/16/2018 1114   CO2 24 03/16/2018 1114   GLUCOSE 97 03/16/2018 1114   BUN 23 03/16/2018 1114   CREATININE 1.13 03/16/2018 1114   CALCIUM 8.9 03/16/2018 1114   GFRNONAA >60 03/16/2018 1114   GFRAA >60 03/16/2018 1114   No results found for: "CHOL", "HDL", "LDLCALC", "LDLDIRECT", "TRIG", "CHOLHDL" No results found for: "HGBA1C" No results found for: "VITAMINB12" Lab Results  Component Value Date   TSH 2.573 02/25/2018    [facility] labs : not accessible.   Unfortunately we could not access the my healthy bed application today.  The patient went through his medication list and there are several medications no longer taken currently on Eliquis, Coreg, Tikosyn, Toprol and Cialis.  PHYSICAL EXAM:  Today's Vitals   08/11/23 0853  BP: (!) 159/82  Pulse: 82  Weight: 252 lb 3.2 oz (114.4 kg)  Height: 6\' 1"  (1.854 m)   Body mass index is 33.27 kg/m.   Wt  Readings from Last 3 Encounters:  08/11/23 252 lb 3.2 oz (114.4 kg)  03/14/18 231 lb 12 oz (105.1 kg)  03/11/18 227 lb 11.2 oz (103.3 kg)     Ht Readings from Last 3 Encounters:  08/11/23 6\' 1"  (1.854 m)  03/14/18 6\' 1"  (1.854 m)  03/11/18 6\' 1"  (1.854 m)      General: The patient is awake, alert and appears not in acute distress. The patient is well groomed. Head: Normocephalic, atraumatic.  Neck is supple. Mallampati 1-2 ,  neck circumference:18 inches . Nasal airflow  patent.  Retrognathia is not seen.  Dental status: crowded , biological  Cardiovascular:  Regular rate and cardiac rhythm by pulse,  without distended neck veins. Respiratory: Lungs are clear to auscultation.  Skin:  Without evidence of ankle edema, or rash. Trunk: The patient's posture is erect.   NEUROLOGIC EXAM: The patient is awake and alert, oriented to place and time.   Memory subjective described as intact.  Attention span & concentration ability appears normal.  Speech is fluent,  without  dysarthria, dysphonia or aphasia.  Mood and affect are appropriate.   Cranial nerves: no loss of smell or taste reported  Pupils are equal and briskly reactive to light. Funduscopic exam deferred.  Extraocular movements in vertical and horizontal planes were intact and without nystagmus. No Diplopia. Visual fields by finger perimetry are intact. Hearing was intact to soft voice and finger rubbing.    Facial sensation intact to fine touch.  Facial motor strength is symmetric and tongue and uvula move midline.  Neck ROM : rotation, tilt and flexion extension were normal for age and shoulder shrug was symmetrical.    Motor exam:  Symmetric bulk, tone and ROM.  Normal tone without cog wheeling, symmetric grip strength .   Sensory:  Fine touch, pinprick and vibration were tested  and  normal.  Proprioception tested in the upper extremities was normal.   Coordination: Rapid alternating movements in the fingers/hands  were of normal speed.  The Finger-to-nose maneuver was intact without evidence of ataxia, dysmetria or tremor.   Gait and station: Patient could rise unassisted from a seated position, walked without assistive device.  Stance is of normal width/ base and the patient turned with 3 steps.  Toe and heel walk were deferred.  Deep tendon reflexes: in the  upper and lower extremities are symmetric and intact.  Babinski response was deferred .     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 2) nocturia is more frequent when drinking caffeine containing fluids or those with carbonation.  This may be not all BPH related,    OSA : has atrial fibrillation ( controlled on medications) he reports sleep related headaches.  He snores, large neck. BMI is 33.Dry mouth in AM :He is trying a chin strap. Biotene mouth wash.    I will invite this patient to a HST for screening of sleep apnea.  If no apnea is seen and no hypoxia is seen, we need not to follow up.   I plan to follow up either personally or through our NP within 3-5 months.   I would like to thank Everardo Beals, MD and Konrad Felix, Np [address] [place],  [place] [address] for allowing me to meet with and to take care of this pleasant patient.    After spending a total time of  45  minutes face to face and additional time for physical and neurologic examination, review of laboratory studies,  personal review of imaging studies, reports and results of other testing and review of referral information / records as far as provided in visit,   Electronically signed by: Melvyn Novas, MD 08/11/2023 9:06 AM  [facility] Board certified by The [employer] of Sleep Medicine and Diplomate of the [employer] of Sleep Medicine. Board certified In Neurology through the ABPN, Fellow of the [employer] of  Neurology.

## 2023-09-21 ENCOUNTER — Telehealth: Payer: Self-pay | Admitting: Neurology

## 2023-09-21 NOTE — Telephone Encounter (Signed)
Pt cancelled appointment due to Patient will be doing sleep study at the [place] on campus

## 2024-06-19 ENCOUNTER — Other Ambulatory Visit (HOSPITAL_COMMUNITY): Payer: Self-pay
